# Patient Record
Sex: Male | Born: 1965
Health system: Southern US, Community
[De-identification: ages and names within clinical notes are randomized; demographics above are authoritative.]

## PROBLEM LIST (undated history)

## (undated) ENCOUNTER — Emergency Department (HOSPITAL_BASED_OUTPATIENT_CLINIC_OR_DEPARTMENT_OTHER): Admission: EM | Payer: Self-pay | Source: Home / Self Care

## (undated) DIAGNOSIS — I10 Essential (primary) hypertension: Secondary | ICD-10-CM

## (undated) DIAGNOSIS — E119 Type 2 diabetes mellitus without complications: Secondary | ICD-10-CM

## (undated) DIAGNOSIS — E785 Hyperlipidemia, unspecified: Secondary | ICD-10-CM

## (undated) DIAGNOSIS — I639 Cerebral infarction, unspecified: Secondary | ICD-10-CM

## (undated) HISTORY — PX: WRIST SURGERY: SHX841

---

## 2001-06-05 ENCOUNTER — Emergency Department (HOSPITAL_COMMUNITY): Admission: EM | Admit: 2001-06-05 | Discharge: 2001-06-05 | Payer: Self-pay | Admitting: Emergency Medicine

## 2004-07-13 ENCOUNTER — Emergency Department: Payer: Self-pay | Admitting: Emergency Medicine

## 2011-12-23 ENCOUNTER — Emergency Department (HOSPITAL_BASED_OUTPATIENT_CLINIC_OR_DEPARTMENT_OTHER)
Admission: EM | Admit: 2011-12-23 | Discharge: 2011-12-24 | Disposition: A | Discharge status: Home / Self Care | Payer: Self-pay | Attending: Emergency Medicine | Admitting: Emergency Medicine

## 2011-12-23 ENCOUNTER — Encounter (HOSPITAL_BASED_OUTPATIENT_CLINIC_OR_DEPARTMENT_OTHER): Payer: Self-pay | Admitting: Emergency Medicine

## 2011-12-23 DIAGNOSIS — K0889 Other specified disorders of teeth and supporting structures: Secondary | ICD-10-CM

## 2011-12-23 DIAGNOSIS — E785 Hyperlipidemia, unspecified: Secondary | ICD-10-CM | POA: Insufficient documentation

## 2011-12-23 DIAGNOSIS — I10 Essential (primary) hypertension: Secondary | ICD-10-CM | POA: Insufficient documentation

## 2011-12-23 DIAGNOSIS — K089 Disorder of teeth and supporting structures, unspecified: Secondary | ICD-10-CM | POA: Insufficient documentation

## 2011-12-23 HISTORY — DX: Essential (primary) hypertension: I10

## 2011-12-23 HISTORY — DX: Hyperlipidemia, unspecified: E78.5

## 2011-12-23 NOTE — ED Notes (Signed)
Pt c/o upper left sided tooth pain x 2 weeks.

## 2011-12-24 MED ORDER — PENICILLIN V POTASSIUM 500 MG PO TABS: 500.0000 mg | ORAL_TABLET | Freq: Three times a day (TID) | ORAL | Status: AC

## 2011-12-24 MED ORDER — HYDROCODONE-ACETAMINOPHEN 5-325 MG PO TABS
1.0000 | ORAL_TABLET | Freq: Four times a day (QID) | ORAL | Status: AC | PRN
Start: 2011-12-24 — End: 2012-01-03

## 2011-12-24 NOTE — Discharge Instructions (Signed)
Dental Pain  A tooth ache may be caused by cavities (tooth decay). Cavities expose the nerve of the tooth to air and hot or cold temperatures. It may come from an infection or abscess (also called a boil or furuncle) around your tooth. It is also often caused by dental caries (tooth decay). This causes the pain you are having.  DIAGNOSIS   Your caregiver can diagnose this problem by exam.  TREATMENT   · If caused by an infection, it may be treated with medications which kill germs (antibiotics) and pain medications as prescribed by your caregiver. Take medications as directed.  · Only take over-the-counter or prescription medicines for pain, discomfort, or fever as directed by your caregiver.  · Whether the tooth ache today is caused by infection or dental disease, you should see your dentist as soon as possible for further care.  SEEK MEDICAL CARE IF:  The exam and treatment you received today has been provided on an emergency basis only. This is not a substitute for complete medical or dental care. If your problem worsens or new problems (symptoms) appear, and you are unable to meet with your dentist, call or return to this location.  SEEK IMMEDIATE MEDICAL CARE IF:   · You have a fever.  · You develop redness and swelling of your face, jaw, or neck.  · You are unable to open your mouth.  · You have severe pain uncontrolled by pain medicine.  MAKE SURE YOU:   · Understand these instructions.  · Will watch your condition.  · Will get help right away if you are not doing well or get worse.  Document Released: 08/09/2005 Document Revised: 07/29/2011 Document Reviewed: 03/27/2008  Exit[employer]® Patient Information ©2012 [employer].

## 2011-12-24 NOTE — ED Notes (Signed)
Dr. Molpus at bedside. 

## 2011-12-24 NOTE — ED Provider Notes (Signed)
History     CSN: [id number]  Arrival date & time 12/23/11  2253   First MD Initiated Contact with Patient 12/24/11 0045      Chief Complaint  Patient presents with  . Dental Pain    (Consider location/radiation/quality/duration/timing/severity/associated sxs/prior treatment) HPI Is a 46 year old black male with about a two-week history of pain in his left upper first molar. He states that that tooth was fractured some time back. He states food gets caught in it and he has trouble keeping it clean. He states the pain is moderate to severe and worse with eating or percussion of the tooth.  Past Medical History  Diagnosis Date  . Hypertension   . Hyperlipemia     Past Surgical History  Procedure Date  . Wrist surgery     No family history on file.  History  Substance Use Topics  . Smoking status: Current Everyday Smoker  . Smokeless tobacco: Not on file  . Alcohol Use: Yes      Review of Systems  All other systems reviewed and are negative.    Allergies  Review of patient's allergies indicates no known allergies.  Home Medications   Current Outpatient Rx  Name Route Sig Dispense Refill  . ACETAMINOPHEN 500 MG PO TABS Oral Take 1,000 mg by mouth every 6 (six) hours as needed. Patient used this medication for his toothache.    Marland Kitchen HYDROCHLOROTHIAZIDE 25 MG PO TABS Oral Take 25 mg by mouth daily.    Marland Kitchen LISINOPRIL 10 MG PO TABS Oral Take 10 mg by mouth daily.      BP 178/102  Pulse 90  Temp 98.2 F (36.8 C)  Resp 18  SpO2 100%  Physical Exam General: Well-developed, well-nourished male in no acute distress; appearance consistent with age of record HENT: normocephalic, atraumatic; Ellis II fracture of left upper first molar, tooth tender to percussion Eyes: Normal appearance Neck: supple Heart: regular rate and rhythm Lungs: clear to auscultation bilaterally Abdomen: soft; nondistended Extremities: No deformity; full range of motion Neurologic: Awake,  alert and oriented; motor function intact in all extremities and symmetric; no facial droop Skin: Warm and dry Psychiatric: Normal mood and affect    ED Course  Procedures (including critical care time)     MDM          Hanley Seamen, MD 12/24/11 0050

## 2012-05-22 ENCOUNTER — Encounter (HOSPITAL_BASED_OUTPATIENT_CLINIC_OR_DEPARTMENT_OTHER): Payer: Self-pay | Admitting: Family Medicine

## 2012-05-22 ENCOUNTER — Emergency Department (HOSPITAL_BASED_OUTPATIENT_CLINIC_OR_DEPARTMENT_OTHER)
Admission: EM | Admit: 2012-05-22 | Discharge: 2012-05-22 | Disposition: A | Discharge status: Home / Self Care | Payer: Self-pay | Attending: Emergency Medicine | Admitting: Emergency Medicine

## 2012-05-22 DIAGNOSIS — K089 Disorder of teeth and supporting structures, unspecified: Secondary | ICD-10-CM | POA: Insufficient documentation

## 2012-05-22 DIAGNOSIS — K0889 Other specified disorders of teeth and supporting structures: Secondary | ICD-10-CM

## 2012-05-22 DIAGNOSIS — I10 Essential (primary) hypertension: Secondary | ICD-10-CM | POA: Insufficient documentation

## 2012-05-22 DIAGNOSIS — E785 Hyperlipidemia, unspecified: Secondary | ICD-10-CM | POA: Insufficient documentation

## 2012-05-22 DIAGNOSIS — F17200 Nicotine dependence, unspecified, uncomplicated: Secondary | ICD-10-CM | POA: Insufficient documentation

## 2012-05-22 MED ORDER — HYDROCODONE-ACETAMINOPHEN 5-325 MG PO TABS: 2.0000 | ORAL_TABLET | ORAL | Status: AC | PRN

## 2012-05-22 MED ORDER — PENICILLIN V POTASSIUM 500 MG PO TABS: 500.0000 mg | ORAL_TABLET | Freq: Four times a day (QID) | ORAL | Status: AC

## 2012-05-22 NOTE — ED Notes (Signed)
Pt c/o left upper dental pain and sts "filling fell out" about 2 months ago. Pt sts ibuprofen and tramadol not working. Pt sts he does not have a dentist.

## 2012-05-22 NOTE — ED Provider Notes (Signed)
History     CSN: [id number]  Arrival date & time 05/22/12  1115   First MD Initiated Contact with Patient 05/22/12 1320      Chief Complaint  Patient presents with  . Dental Pain    (Consider location/radiation/quality/duration/timing/severity/associated sxs/prior treatment) Patient is a 46 y.o. male presenting with tooth pain. The history is provided by the patient. No language interpreter was used.  Dental PainThe primary symptoms include mouth pain. The symptoms began more than 1 month ago. The symptoms are worsening. The symptoms are new. The symptoms occur constantly.  Additional symptoms do not include: gum swelling.  Pt complains of a broken tooth.  Pt recently released from prison. No dentist  Past Medical History  Diagnosis Date  . Hypertension   . Hyperlipemia     Past Surgical History  Procedure Date  . Wrist surgery     No family history on file.  History  Substance Use Topics  . Smoking status: Current Every Day Smoker  . Smokeless tobacco: Not on file  . Alcohol Use: Yes      Review of Systems  HENT: Positive for dental problem.   All other systems reviewed and are negative.    Allergies  Review of patient's allergies indicates no known allergies.  Home Medications   Current Outpatient Rx  Name Route Sig Dispense Refill  . VERAPAMIL HCL PO Oral Take by mouth.    . ACETAMINOPHEN 500 MG PO TABS Oral Take 1,000 mg by mouth every 6 (six) hours as needed. Patient used this medication for his toothache.    Marland Kitchen HYDROCHLOROTHIAZIDE 25 MG PO TABS Oral Take 25 mg by mouth daily.    Marland Kitchen LISINOPRIL 10 MG PO TABS Oral Take 10 mg by mouth daily.      BP 180/105  Pulse 84  Temp 98.8 F (37.1 C) (Oral)  Resp 16  Ht 5\' 11"  (1.803 m)  Wt 215 lb (97.523 kg)  BMI 29.99 kg/m2  SpO2 99%  Physical Exam  Nursing note and vitals reviewed. Constitutional: He appears well-developed and well-nourished.  HENT:  Head: Normocephalic and atraumatic.  Right Ear:  External ear normal.       Broken tooth left upper molar  Eyes: Conjunctivae normal and EOM are normal. Pupils are equal, round, and reactive to light.  Neck: Normal range of motion. Neck supple.  Cardiovascular: Normal rate.   Pulmonary/Chest: Effort normal.  Musculoskeletal: Normal range of motion.  Neurological: He is alert.    ED Course  Procedures (including critical care time)  Labs Reviewed - No data to display No results found.   No diagnosis found.    MDM  pcn vk  And hydrocodone        Lonia Skinner [place], [place] 05/22/12 [phone]

## 2012-05-22 NOTE — ED Notes (Signed)
Pt c/o of pain in tooth, left upper back. Denies n/v, dizziness. States that pain worsens with eating. No swelling noted.

## 2012-05-22 NOTE — ED Provider Notes (Signed)
Medical screening examination/treatment/procedure(s) were performed by non-physician practitioner and as supervising physician I was immediately available for consultation/collaboration.   Ellwood Steidle, MD 05/22/12 1729 

## 2012-08-02 ENCOUNTER — Emergency Department (HOSPITAL_BASED_OUTPATIENT_CLINIC_OR_DEPARTMENT_OTHER)
Admission: EM | Admit: 2012-08-02 | Discharge: 2012-08-02 | Disposition: A | Discharge status: Home / Self Care | Payer: Self-pay | Attending: Emergency Medicine | Admitting: Emergency Medicine

## 2012-08-02 ENCOUNTER — Other Ambulatory Visit: Payer: Self-pay

## 2012-08-02 ENCOUNTER — Encounter (HOSPITAL_BASED_OUTPATIENT_CLINIC_OR_DEPARTMENT_OTHER): Payer: Self-pay | Admitting: *Deleted

## 2012-08-02 DIAGNOSIS — F17200 Nicotine dependence, unspecified, uncomplicated: Secondary | ICD-10-CM | POA: Insufficient documentation

## 2012-08-02 DIAGNOSIS — E785 Hyperlipidemia, unspecified: Secondary | ICD-10-CM | POA: Insufficient documentation

## 2012-08-02 DIAGNOSIS — Z79899 Other long term (current) drug therapy: Secondary | ICD-10-CM | POA: Insufficient documentation

## 2012-08-02 DIAGNOSIS — I10 Essential (primary) hypertension: Secondary | ICD-10-CM | POA: Insufficient documentation

## 2012-08-02 MED ORDER — LISINOPRIL 20 MG PO TABS: 10.0000 mg | ORAL_TABLET | Freq: Every day | ORAL | Status: DC

## 2012-08-02 MED ORDER — VERAPAMIL HCL ER 120 MG PO CP24: 120.0000 mg | ORAL_CAPSULE | Freq: Every day | ORAL | Status: AC

## 2012-08-02 MED ORDER — HYDROCHLOROTHIAZIDE 25 MG PO TABS: 25.0000 mg | ORAL_TABLET | Freq: Every day | ORAL | Status: DC

## 2012-08-02 NOTE — ED Notes (Signed)
Pt c/o hypertension , pt was sent from [facility] for increased bp x 2 day

## 2012-08-02 NOTE — ED Provider Notes (Addendum)
History     CSN: [id number]  Arrival date & time 08/02/12  1327   First MD Initiated Contact with Patient 08/02/12 1340      Chief Complaint  Patient presents with  . Hypertension    (Consider location/radiation/quality/duration/timing/severity/associated sxs/prior treatment) HPI Comments: Patient was sent over here with elevated blood pressure. He states his blood pressures been elevated for last 2 days. He denies any current symptoms. He had a headache yesterday but denies any headache today. Denies any nausea or vomiting. Denies any chest pain or shortness of breath. Denies any dizziness. Denies any other neurologic symptoms. He does have a history of hypertension and has been off his medications for about the last 9 days. He states that his last medication was taking when he was in inpatient rehabilitation. Then he went home he had run out of his medication and currently he is in [facility] inpatient rehabilitation.  Patient is a 46 y.o. male presenting with hypertension.  Hypertension Pertinent negatives include no chest pain, no abdominal pain, no headaches and no shortness of breath.    Past Medical History  Diagnosis Date  . Hypertension   . Hyperlipemia     Past Surgical History  Procedure Date  . Wrist surgery     History reviewed. No pertinent family history.  History  Substance Use Topics  . Smoking status: Current Every Day Smoker -- 0.5 packs/day    Types: Cigarettes  . Smokeless tobacco: Not on file  . Alcohol Use: No      Review of Systems  Constitutional: Negative for fever, chills, diaphoresis and fatigue.  HENT: Negative for congestion, rhinorrhea and sneezing.   Eyes: Negative.   Respiratory: Negative for cough, chest tightness and shortness of breath.   Cardiovascular: Negative for chest pain and leg swelling.  Gastrointestinal: Negative for nausea, vomiting, abdominal pain, diarrhea and blood in stool.  Genitourinary: Negative for frequency,  hematuria, flank pain and difficulty urinating.  Musculoskeletal: Negative for back pain and arthralgias.  Skin: Negative for rash.  Neurological: Negative for dizziness, speech difficulty, weakness, numbness and headaches.    Allergies  Review of patient's allergies indicates no known allergies.  Home Medications   Current Outpatient Rx  Name  Route  Sig  Dispense  Refill  . ACETAMINOPHEN 500 MG PO TABS   Oral   Take 1,000 mg by mouth every 6 (six) hours as needed. Patient used this medication for his toothache.         Marland Kitchen HYDROCHLOROTHIAZIDE 25 MG PO TABS   Oral   Take 25 mg by mouth daily.         Marland Kitchen HYDROCHLOROTHIAZIDE 25 MG PO TABS   Oral   Take 1 tablet (25 mg total) by mouth daily.   30 tablet   1   . LISINOPRIL 10 MG PO TABS   Oral   Take 10 mg by mouth daily.         Marland Kitchen LISINOPRIL 20 MG PO TABS   Oral   Take 0.5 tablets (10 mg total) by mouth daily.   30 tablet   1   . VERAPAMIL HCL ER 120 MG PO CP24   Oral   Take 1 capsule (120 mg total) by mouth at bedtime.   30 capsule   1   . VERAPAMIL HCL PO   Oral   Take by mouth.           BP 184/98  Pulse 91  Temp 98.8 F (37.1 C) (  Oral)  Resp 20  Ht 5\' 11"  (1.803 m)  Wt 205 lb (92.987 kg)  BMI 28.59 kg/m2  SpO2 100%  Physical Exam  Constitutional: He is oriented to person, place, and time. He appears well-developed and well-nourished.  HENT:  Head: Normocephalic and atraumatic.  Eyes: Pupils are equal, round, and reactive to light.  Neck: Normal range of motion. Neck supple.  Cardiovascular: Normal rate, regular rhythm and normal heart sounds.   Pulmonary/Chest: Effort normal and breath sounds normal. No respiratory distress. He has no wheezes. He has no rales. He exhibits no tenderness.  Abdominal: Soft. Bowel sounds are normal. There is no tenderness. There is no rebound and no guarding.  Musculoskeletal: Normal range of motion. He exhibits no edema.  Lymphadenopathy:    He has no cervical  adenopathy.  Neurological: He is alert and oriented to person, place, and time. He has normal strength. No cranial nerve deficit or sensory deficit. GCS eye subscore is 4. GCS verbal subscore is 5. GCS motor subscore is 6.  Skin: Skin is warm and dry. No rash noted.  Psychiatric: He has a normal mood and affect.    ED Course  Procedures (including critical care time)  Labs Reviewed - No data to display No results found.   Date: 08/02/2012  Rate: 87  Rhythm: normal sinus rhythm  QRS Axis: left  Intervals: normal  ST/T Wave abnormalities: nonspecific ST/T changes  Conduction Disutrbances:none  Narrative Interpretation:   Old EKG Reviewed: unchanged   1. Hypertension       MDM  Patient presents with elevated blood pressure. He is currently asymptomatic. An EKG was obtained and is compared with an EKG from [facility] and appears to be unchanged. I did give him prescriptions for his 3 blood pressure medications and advised him to start taking them immediately. Advised to return if it's blood pressure worsens or if he develops any symptoms with elevated blood pressure. He has appointment next month to followup with his primary care physician at the free clinic in [facility].        Rolan Bucco, MD 08/02/12 1421  Rolan Bucco, MD 08/02/12 [phone]

## 2012-08-13 ENCOUNTER — Emergency Department (HOSPITAL_BASED_OUTPATIENT_CLINIC_OR_DEPARTMENT_OTHER)
Admission: EM | Admit: 2012-08-13 | Discharge: 2012-08-13 | Disposition: A | Discharge status: Home / Self Care | Payer: Self-pay | Attending: Emergency Medicine | Admitting: Emergency Medicine

## 2012-08-13 ENCOUNTER — Encounter (HOSPITAL_BASED_OUTPATIENT_CLINIC_OR_DEPARTMENT_OTHER): Payer: Self-pay | Admitting: *Deleted

## 2012-08-13 DIAGNOSIS — K0889 Other specified disorders of teeth and supporting structures: Secondary | ICD-10-CM

## 2012-08-13 DIAGNOSIS — I10 Essential (primary) hypertension: Secondary | ICD-10-CM | POA: Insufficient documentation

## 2012-08-13 DIAGNOSIS — Z79899 Other long term (current) drug therapy: Secondary | ICD-10-CM | POA: Insufficient documentation

## 2012-08-13 DIAGNOSIS — J392 Other diseases of pharynx: Secondary | ICD-10-CM | POA: Insufficient documentation

## 2012-08-13 DIAGNOSIS — F17200 Nicotine dependence, unspecified, uncomplicated: Secondary | ICD-10-CM | POA: Insufficient documentation

## 2012-08-13 DIAGNOSIS — K089 Disorder of teeth and supporting structures, unspecified: Secondary | ICD-10-CM | POA: Insufficient documentation

## 2012-08-13 DIAGNOSIS — E785 Hyperlipidemia, unspecified: Secondary | ICD-10-CM | POA: Insufficient documentation

## 2012-08-13 MED ORDER — AMOXICILLIN 500 MG PO CAPS: 500.0000 mg | ORAL_CAPSULE | Freq: Three times a day (TID) | ORAL | Status: DC

## 2012-08-13 MED ORDER — TRAMADOL HCL 50 MG PO TABS: 50.0000 mg | ORAL_TABLET | Freq: Four times a day (QID) | ORAL | Status: AC | PRN

## 2012-08-13 MED ORDER — TRAMADOL HCL 50 MG PO TABS: 50.0000 mg | ORAL_TABLET | Freq: Four times a day (QID) | ORAL | Status: DC | PRN

## 2012-08-13 NOTE — ED Notes (Signed)
Pt states he is having dental pain. Tried sealant, but it came out and pain has been worse  Since. Also wants throat checked.

## 2012-08-13 NOTE — ED Provider Notes (Signed)
History     CSN: [id number]  Arrival date & time 08/13/12  1324   First MD Initiated Contact with Patient 08/13/12 1902      Chief Complaint  Patient presents with  . Dental Pain    (Consider location/radiation/quality/duration/timing/severity/associated sxs/prior treatment) Patient is a 46 y.o. male presenting with tooth pain. The history is provided by the patient. No language interpreter was used.  Dental PainThe primary symptoms include mouth pain. The symptoms began more than 1 week ago. The symptoms are worsening. The symptoms are new. The symptoms occur constantly.  Additional symptoms include: gum tenderness and facial swelling. Medical issues do not include: alcohol problem.    Past Medical History  Diagnosis Date  . Hypertension   . Hyperlipemia     Past Surgical History  Procedure Date  . Wrist surgery     History reviewed. No pertinent family history.  History  Substance Use Topics  . Smoking status: Current Every Day Smoker -- 0.5 packs/day    Types: Cigarettes  . Smokeless tobacco: Not on file  . Alcohol Use: No      Review of Systems  HENT: Positive for facial swelling and dental problem.   All other systems reviewed and are negative.    Allergies  Review of patient's allergies indicates no known allergies.  Home Medications   Current Outpatient Rx  Name  Route  Sig  Dispense  Refill  . ACETAMINOPHEN 500 MG PO TABS   Oral   Take 1,000 mg by mouth every 6 (six) hours as needed. Patient used this medication for his toothache.         Marland Kitchen HYDROCHLOROTHIAZIDE 25 MG PO TABS   Oral   Take 25 mg by mouth daily.         Marland Kitchen HYDROCHLOROTHIAZIDE 25 MG PO TABS   Oral   Take 1 tablet (25 mg total) by mouth daily.   30 tablet   1   . LISINOPRIL 10 MG PO TABS   Oral   Take 10 mg by mouth daily.         Marland Kitchen LISINOPRIL 20 MG PO TABS   Oral   Take 0.5 tablets (10 mg total) by mouth daily.   30 tablet   1   . VERAPAMIL HCL ER 120 MG PO  CP24   Oral   Take 1 capsule (120 mg total) by mouth at bedtime.   30 capsule   1   . VERAPAMIL HCL PO   Oral   Take by mouth.           BP 166/85  Pulse 76  Temp 99.1 F (37.3 C) (Oral)  Resp 20  Ht 5\' 11"  (1.803 m)  Wt 205 lb (92.987 kg)  BMI 28.59 kg/m2  SpO2 99%  Physical Exam  Nursing note and vitals reviewed. Constitutional: He appears well-developed and well-nourished.  HENT:  Head: Normocephalic and atraumatic.  Right Ear: External ear normal.  Left Ear: External ear normal.  Nose: Nose normal.       Swollen gumline,  Throat clear  Eyes: Conjunctivae normal and EOM are normal. Pupils are equal, round, and reactive to light.  Neck: Normal range of motion.  Cardiovascular: Normal rate.   Pulmonary/Chest: Effort normal.  Neurological: He is alert.  Skin: Skin is warm.    ED Course  Procedures (including critical care time)  Labs Reviewed - No data to display No results found.   1. Toothache   2. Pharyngeal pain     (  Pt reports cough at night and drainage  MDM  amoxicillian and tramadol.  Follow up with Dr. Ninetta Lights dentist.         Lonia Skinner [place], [place] 08/13/12 2204  Lonia Skinner [place], [place] 08/13/12 2204  Lonia Skinner [place], [place] 08/13/12 2204

## 2012-08-14 NOTE — ED Provider Notes (Signed)
Medical screening examination/treatment/procedure(s) were performed by non-physician practitioner and as supervising physician I was immediately available for consultation/collaboration.  Hulon Ferron, MD 08/14/12 2244 

## 2013-06-17 ENCOUNTER — Encounter (HOSPITAL_BASED_OUTPATIENT_CLINIC_OR_DEPARTMENT_OTHER): Payer: Self-pay | Admitting: Emergency Medicine

## 2013-06-17 ENCOUNTER — Emergency Department (HOSPITAL_BASED_OUTPATIENT_CLINIC_OR_DEPARTMENT_OTHER): Payer: Medicaid Other

## 2013-06-17 ENCOUNTER — Emergency Department (HOSPITAL_BASED_OUTPATIENT_CLINIC_OR_DEPARTMENT_OTHER)
Admission: EM | Admit: 2013-06-17 | Discharge: 2013-06-17 | Disposition: A | Discharge status: Home / Self Care | Payer: Medicaid Other | Attending: Emergency Medicine | Admitting: Emergency Medicine

## 2013-06-17 DIAGNOSIS — Z79899 Other long term (current) drug therapy: Secondary | ICD-10-CM | POA: Insufficient documentation

## 2013-06-17 DIAGNOSIS — R209 Unspecified disturbances of skin sensation: Secondary | ICD-10-CM | POA: Insufficient documentation

## 2013-06-17 DIAGNOSIS — H539 Unspecified visual disturbance: Secondary | ICD-10-CM | POA: Insufficient documentation

## 2013-06-17 DIAGNOSIS — R079 Chest pain, unspecified: Secondary | ICD-10-CM

## 2013-06-17 DIAGNOSIS — I10 Essential (primary) hypertension: Secondary | ICD-10-CM | POA: Insufficient documentation

## 2013-06-17 DIAGNOSIS — M1990 Unspecified osteoarthritis, unspecified site: Secondary | ICD-10-CM

## 2013-06-17 DIAGNOSIS — E785 Hyperlipidemia, unspecified: Secondary | ICD-10-CM | POA: Insufficient documentation

## 2013-06-17 DIAGNOSIS — M545 Low back pain, unspecified: Secondary | ICD-10-CM | POA: Insufficient documentation

## 2013-06-17 DIAGNOSIS — F17200 Nicotine dependence, unspecified, uncomplicated: Secondary | ICD-10-CM | POA: Insufficient documentation

## 2013-06-17 DIAGNOSIS — M549 Dorsalgia, unspecified: Secondary | ICD-10-CM

## 2013-06-17 DIAGNOSIS — R51 Headache: Secondary | ICD-10-CM | POA: Insufficient documentation

## 2013-06-17 DIAGNOSIS — Z792 Long term (current) use of antibiotics: Secondary | ICD-10-CM | POA: Insufficient documentation

## 2013-06-17 DIAGNOSIS — IMO0002 Reserved for concepts with insufficient information to code with codable children: Secondary | ICD-10-CM | POA: Insufficient documentation

## 2013-06-17 LAB — TROPONIN I: Troponin I: <0.3 ng/mL (ref <0.30)

## 2013-06-17 LAB — BASIC METABOLIC PANEL
CO2: 25 mEq/L (ref 19–32)
Chloride: 106 mEq/L (ref 96–112)
Creatinine, Ser: 1.4 mg/dL — ABNORMAL HIGH (ref 0.50–1.35)
Sodium: 141 mEq/L (ref 135–145)

## 2013-06-17 LAB — CBC
Hemoglobin: 10.9 g/dL — ABNORMAL LOW (ref 13.0–17.0)
MCV: 72.7 fL — ABNORMAL LOW (ref 78.0–100.0)
Platelets: 347 10*3/uL (ref 150–400)
RBC: 4.77 MIL/uL (ref 4.22–5.81)
WBC: 9.2 10*3/uL (ref 4.0–10.5)

## 2013-06-17 MED ORDER — NAPROXEN 500 MG PO TABS: 500.0000 mg | ORAL_TABLET | Freq: Two times a day (BID) | ORAL | Status: AC

## 2013-06-17 MED ORDER — IBUPROFEN 800 MG PO TABS
800.0000 mg | ORAL_TABLET | Freq: Once | ORAL | Status: AC
  Administered 2013-06-17: 800 mg via ORAL
  Filled 2013-06-17: qty 1
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
## 2013-06-17 NOTE — ED Notes (Signed)
Patient states that every time he saw a dr while in prison, he would be transferred to another jail so that "they didn't have to do their job." He also stated that he was seen by a dr at one point and that he was put in a room where he had to sleep on the floor and there were rats everywhere.

## 2013-06-17 NOTE — ED Notes (Signed)
RN at bedside

## 2013-06-17 NOTE — ED Notes (Addendum)
Patient states that he is having chest pain, back pain, headache, and memory problems. States that his problems started when he went to prison, which he stayed in for 5/6 months. He states that "something isnt right, but they didn't have time to treat me". Patient was released from jail within the past few weeks. He states that the drs weren't doing what they were supposed to be doing and that he just keep forgetting things. His eyes are burning and "real bad" headaches. States his right arm gets numb also. He went to [facility] yesterday but left after being seen because "they were too busy". Patient also came here yesterday but left before triage.

## 2013-06-20 NOTE — ED Provider Notes (Signed)
Medical screening examination/treatment/procedure(s) were performed by non-physician practitioner and as supervising physician I was immediately available for consultation/collaboration.  EKG Interpretation   None        Corneisha Alvi, MD 06/20/13 0700 

## 2014-02-25 ENCOUNTER — Encounter (HOSPITAL_BASED_OUTPATIENT_CLINIC_OR_DEPARTMENT_OTHER): Payer: Self-pay | Admitting: Emergency Medicine

## 2014-02-25 ENCOUNTER — Emergency Department (HOSPITAL_BASED_OUTPATIENT_CLINIC_OR_DEPARTMENT_OTHER)
Admission: EM | Admit: 2014-02-25 | Discharge: 2014-02-25 | Disposition: A | Discharge status: Home / Self Care | Payer: Medicaid Other | Attending: Emergency Medicine | Admitting: Emergency Medicine

## 2014-02-25 DIAGNOSIS — H7290 Unspecified perforation of tympanic membrane, unspecified ear: Secondary | ICD-10-CM | POA: Diagnosis not present

## 2014-02-25 DIAGNOSIS — H9209 Otalgia, unspecified ear: Secondary | ICD-10-CM | POA: Diagnosis present

## 2014-02-25 DIAGNOSIS — Z8673 Personal history of transient ischemic attack (TIA), and cerebral infarction without residual deficits: Secondary | ICD-10-CM | POA: Diagnosis not present

## 2014-02-25 DIAGNOSIS — F17200 Nicotine dependence, unspecified, uncomplicated: Secondary | ICD-10-CM | POA: Diagnosis not present

## 2014-02-25 DIAGNOSIS — H7291 Unspecified perforation of tympanic membrane, right ear: Secondary | ICD-10-CM

## 2014-02-25 DIAGNOSIS — Z791 Long term (current) use of non-steroidal anti-inflammatories (NSAID): Secondary | ICD-10-CM | POA: Insufficient documentation

## 2014-02-25 DIAGNOSIS — E119 Type 2 diabetes mellitus without complications: Secondary | ICD-10-CM | POA: Diagnosis not present

## 2014-02-25 DIAGNOSIS — I10 Essential (primary) hypertension: Secondary | ICD-10-CM | POA: Diagnosis not present

## 2014-02-25 DIAGNOSIS — Z79899 Other long term (current) drug therapy: Secondary | ICD-10-CM | POA: Diagnosis not present

## 2014-02-25 HISTORY — DX: Cerebral infarction, unspecified: I63.9

## 2014-02-25 HISTORY — DX: Type 2 diabetes mellitus without complications: E11.9

## 2014-02-25 MED ORDER — AMOXICILLIN 500 MG PO CAPS: 500.0000 mg | ORAL_CAPSULE | Freq: Three times a day (TID) | ORAL | Status: AC

## 2014-02-25 MED ORDER — HYDROCODONE-ACETAMINOPHEN 5-325 MG PO TABS: 2.0000 | ORAL_TABLET | ORAL | Status: DC | PRN

## 2014-02-25 NOTE — ED Provider Notes (Signed)
CSN: [id number]     Arrival date & time 02/25/14  1901 History   First MD Initiated Contact with Patient 02/25/14 2203     Chief Complaint  Patient presents with  . Otalgia     (Consider location/radiation/quality/duration/timing/severity/associated sxs/prior Treatment) Patient is a 48 y.o. male presenting with ear pain. The history is provided by the patient and a relative. History limited by: Pt nonverbal second to a cva. No language interpreter was used.  Otalgia Location:  Right Quality:  Aching Severity:  Moderate Onset quality:  Gradual Timing:  Constant Progression:  Worsening Chronicity:  New Relieved by:  Nothing Worsened by:  Nothing tried Ineffective treatments:  None tried Associated symptoms: no rhinorrhea and no sore throat     Past Medical History  Diagnosis Date  . Hypertension   . Hyperlipemia   . Stroke   . Diabetes mellitus without complication    Past Surgical History  Procedure Laterality Date  . Wrist surgery     No family history on file. History  Substance Use Topics  . Smoking status: Current Every Day Smoker -- 0.50 packs/day    Types: Cigarettes  . Smokeless tobacco: Not on file  . Alcohol Use: No    Review of Systems  HENT: Positive for ear pain. Negative for rhinorrhea and sore throat.   All other systems reviewed and are negative.     Allergies  Review of patient's allergies indicates no known allergies.  Home Medications   Prior to Admission medications   Medication Sig Start Date End Date Taking? Authorizing Provider  metFORMIN (GLUCOPHAGE) 1000 MG tablet Take 1,000 mg by mouth 2 (two) times daily with a meal.   Yes Historical Provider, MD  acetaminophen (TYLENOL) 500 MG tablet Take 1,000 mg by mouth every 6 (six) hours as needed. Patient used this medication for his toothache.    Historical Provider, MD  amoxicillin (AMOXIL) 500 MG capsule Take 1 capsule (500 mg total) by mouth 3 (three) times daily. 08/13/12   Elson AreasLeslie K  Diarra Kos, PA-C  hydrochlorothiazide (HYDRODIURIL) 25 MG tablet Take 25 mg by mouth daily.    Historical Provider, MD  hydrochlorothiazide (HYDRODIURIL) 25 MG tablet Take 1 tablet (25 mg total) by mouth daily. 08/02/12   Rolan BuccoMelanie Belfi, MD  lisinopril (PRINIVIL,ZESTRIL) 10 MG tablet Take 10 mg by mouth daily.    Historical Provider, MD  lisinopril (PRINIVIL,ZESTRIL) 20 MG tablet Take 0.5 tablets (10 mg total) by mouth daily. 08/02/12   Rolan BuccoMelanie Belfi, MD  naproxen (NAPROSYN) 500 MG tablet Take 1 tablet (500 mg total) by mouth 2 (two) times daily. 06/17/13   Trevor Maceobyn M Albert, PA-C  traMADol (ULTRAM) 50 MG tablet Take 1 tablet (50 mg total) by mouth every 6 (six) hours as needed for pain. 08/13/12   Elson AreasLeslie K Arling Cerone, PA-C  verapamil (VERELAN PM) 120 MG 24 hr capsule Take 1 capsule (120 mg total) by mouth at bedtime. 08/02/12   Rolan BuccoMelanie Belfi, MD  VERAPAMIL HCL PO Take by mouth.    Historical Provider, MD   BP 129/77  Pulse 87  Temp(Src) 98.3 F (36.8 C) (Oral)  Resp 18  Ht 5\' 11"  (1.803 m)  Wt 190 lb (86.183 kg)  BMI 26.51 kg/m2  SpO2 100% Physical Exam  Nursing note and vitals reviewed. Constitutional: He is oriented to person, place, and time. He appears well-developed and well-nourished.  HENT:  Head: Normocephalic.  Left Ear: External ear normal.  Mouth/Throat: Oropharynx is clear and moist.  Right ear canal,  Dried blood,  Small amount of bright blood.   Eyes: EOM are normal.  Neck: Normal range of motion.  Pulmonary/Chest: Effort normal.  Abdominal: He exhibits no distension.  Musculoskeletal: Normal range of motion.  Neurological: He is alert and oriented to person, place, and time.  Psychiatric: He has a normal mood and affect.    ED Course  Procedures (including critical care time) Labs Review Labs Reviewed - No data to display  Imaging Review No results found.   EKG Interpretation None      MDM  I suspect pt has a perforated Tm.   (from family it seems pt has discomfort  before blood)  They deny pt has put anything in his ear.  I will treat with antibiotics and pain medication.   They are advised to see Dr. Emeline DarlingGore for evaluation   Final diagnoses:  Perforated tympanic membrane, right        Elson AreasLeslie K Cledith Abdou, PA-C 02/25/14 2234

## 2014-02-25 NOTE — ED Notes (Signed)
Pt with right ear pain, decreased hearing and drainage today.

## 2014-02-25 NOTE — Discharge Instructions (Signed)
Eardrum Perforation °The eardrum is a thin, round tissue inside the ear that separates the ear canal from the middle ear. This is the tissue that detects sound and enables you to hear. The eardrum can be punctured or torn (perforated). Eardrums generally heal without help and with little or no permanent hearing loss. °CAUSES  °· Sudden pressure changes that happen in situations like scuba diving or flying in an airplane. °· Foreign objects in the ear. °· Inserting a cotton-tipped swab in the ear. °· Loud noise. °· Trauma to the ear. °SYMPTOMS  °· Hearing loss. °· Ear pain. °· Ringing in the ears. °· Discharge or bleeding from the ear. °· Dizziness. °· Vomiting. °· Facial paralysis. °HOME CARE INSTRUCTIONS  °· Keep your ear dry, as this improves healing. Swimming, diving, and showers are not allowed until healing is complete. While bathing, protect the ear by placing a piece of cotton covered with petroleum jelly in the outer ear canal. °· Only take over-the-counter or prescription medicines for pain, discomfort, or fever as directed by your caregiver. °· Blow your nose gently. Forceful blowing increases the pressure in the middle ear and may cause further injury or delay healing. °· Resume normal activities, such as showering, when the perforation has healed. Your caregiver can let you know when this has occurred. °· Talk to your caregiver before flying on an airplane. Air travel is generally allowed with a perforated eardrum. °· If your caregiver has given you a follow-up appointment, it is very important to keep that appointment. Failure to keep the appointment could result in a chronic or permanent injury, pain, hearing loss, and disability. °SEEK IMMEDIATE MEDICAL CARE IF:  °· You have bleeding or pus coming from your ear. °· You have problems with balance, dizziness, nausea, or vomiting. °· You develop increased pain. °· You have a fever. °MAKE SURE YOU:  °· Understand these instructions. °· Will watch your  condition. °· Will get help right away if you are not doing well or get worse. °Document Released: 08/06/2000 Document Revised: 11/01/2011 Document Reviewed: 08/08/2008 °ExitCare® Patient Information ©2015 [employer]. This information is not intended to replace advice given to you by your health care provider. Make sure you discuss any questions you have with your health care provider. ° °

## 2014-02-26 NOTE — ED Provider Notes (Signed)
Medical screening examination/treatment/procedure(s) were performed by non-physician practitioner and as supervising physician I was immediately available for consultation/collaboration.   EKG Interpretation None       Gearldine Looney, MD 02/26/14 0017 

## 2014-05-27 ENCOUNTER — Emergency Department (HOSPITAL_BASED_OUTPATIENT_CLINIC_OR_DEPARTMENT_OTHER)
Admission: EM | Admit: 2014-05-27 | Discharge: 2014-05-27 | Disposition: A | Discharge status: Home / Self Care | Payer: Medicaid Other | Attending: Emergency Medicine | Admitting: Emergency Medicine

## 2014-05-27 ENCOUNTER — Encounter (HOSPITAL_BASED_OUTPATIENT_CLINIC_OR_DEPARTMENT_OTHER): Payer: Self-pay | Admitting: Emergency Medicine

## 2014-05-27 DIAGNOSIS — Z720 Tobacco use: Secondary | ICD-10-CM | POA: Insufficient documentation

## 2014-05-27 DIAGNOSIS — Z8673 Personal history of transient ischemic attack (TIA), and cerebral infarction without residual deficits: Secondary | ICD-10-CM | POA: Insufficient documentation

## 2014-05-27 DIAGNOSIS — I10 Essential (primary) hypertension: Secondary | ICD-10-CM | POA: Diagnosis not present

## 2014-05-27 DIAGNOSIS — Z79899 Other long term (current) drug therapy: Secondary | ICD-10-CM | POA: Insufficient documentation

## 2014-05-27 DIAGNOSIS — K088 Other specified disorders of teeth and supporting structures: Secondary | ICD-10-CM | POA: Diagnosis not present

## 2014-05-27 DIAGNOSIS — Z791 Long term (current) use of non-steroidal anti-inflammatories (NSAID): Secondary | ICD-10-CM | POA: Diagnosis not present

## 2014-05-27 DIAGNOSIS — E119 Type 2 diabetes mellitus without complications: Secondary | ICD-10-CM | POA: Insufficient documentation

## 2014-05-27 DIAGNOSIS — K0889 Other specified disorders of teeth and supporting structures: Secondary | ICD-10-CM

## 2014-05-27 MED ORDER — HYDROCODONE-ACETAMINOPHEN 5-325 MG PO TABS: 2.0000 | ORAL_TABLET | ORAL | Status: DC | PRN

## 2014-05-27 MED ORDER — AMOXICILLIN 500 MG PO CAPS: 500.0000 mg | ORAL_CAPSULE | Freq: Three times a day (TID) | ORAL | Status: AC

## 2014-05-27 NOTE — ED Notes (Signed)
Dental pain for 3 days  

## 2014-05-27 NOTE — ED Provider Notes (Signed)
History/physical exam/procedure(s) were performed by non-physician practitioner and as supervising physician I was immediately available for consultation/collaboration. I have reviewed all notes and am in agreement with care and plan.   Hilario Quarryanielle S Johnthomas Lader, MD 05/27/14 [id number]

## 2014-05-27 NOTE — Discharge Instructions (Signed)

## 2014-05-27 NOTE — ED Provider Notes (Signed)
CSN: [id number]     Arrival date & time 05/27/14  1039 History   First MD Initiated Contact with Patient 05/27/14 1141     Chief Complaint  Patient presents with  . Dental Pain     (Consider location/radiation/quality/duration/timing/severity/associated sxs/prior Treatment) Patient is a 48 y.o. male presenting with tooth pain. The history is provided by the patient. No language interpreter was used.  Dental Pain Location:  Upper and lower Quality:  Throbbing Onset quality:  Gradual Duration:  3 days Progression:  Worsening Chronicity:  New Context: not abscess   Relieved by:  Nothing Worsened by:  Nothing tried Associated symptoms: gum swelling   Associated symptoms: no facial swelling     Past Medical History  Diagnosis Date  . Hypertension   . Hyperlipemia   . Stroke   . Diabetes mellitus without complication    Past Surgical History  Procedure Laterality Date  . Wrist surgery     No family history on file. History  Substance Use Topics  . Smoking status: Current Every Day Smoker -- 0.50 packs/day    Types: Cigarettes  . Smokeless tobacco: Not on file  . Alcohol Use: No    Review of Systems  HENT: Negative for facial swelling.   All other systems reviewed and are negative.     Allergies  Review of patient's allergies indicates no known allergies.  Home Medications   Prior to Admission medications   Medication Sig Start Date End Date Taking? Authorizing Provider  acetaminophen (TYLENOL) 500 MG tablet Take 1,000 mg by mouth every 6 (six) hours as needed. Patient used this medication for his toothache.    Historical Provider, MD  amoxicillin (AMOXIL) 500 MG capsule Take 1 capsule (500 mg total) by mouth 3 (three) times daily. 08/13/12   Elson Areas, PA-C  hydrochlorothiazide (HYDRODIURIL) 25 MG tablet Take 25 mg by mouth daily.    Historical Provider, MD  hydrochlorothiazide (HYDRODIURIL) 25 MG tablet Take 1 tablet (25 mg total) by mouth daily. 08/02/12    Rolan Bucco, MD  HYDROcodone-acetaminophen (NORCO/VICODIN) 5-325 MG per tablet Take 2 tablets by mouth every 4 (four) hours as needed. 02/25/14   Elson Areas, PA-C  lisinopril (PRINIVIL,ZESTRIL) 10 MG tablet Take 10 mg by mouth daily.    Historical Provider, MD  lisinopril (PRINIVIL,ZESTRIL) 20 MG tablet Take 0.5 tablets (10 mg total) by mouth daily. 08/02/12   Rolan Bucco, MD  metFORMIN (GLUCOPHAGE) 1000 MG tablet Take 1,000 mg by mouth 2 (two) times daily with a meal.    Historical Provider, MD  naproxen (NAPROSYN) 500 MG tablet Take 1 tablet (500 mg total) by mouth 2 (two) times daily. 06/17/13   Trevor Mace, PA-C  traMADol (ULTRAM) 50 MG tablet Take 1 tablet (50 mg total) by mouth every 6 (six) hours as needed for pain. 08/13/12   Elson Areas, PA-C  verapamil (VERELAN PM) 120 MG 24 hr capsule Take 1 capsule (120 mg total) by mouth at bedtime. 08/02/12   Rolan Bucco, MD  VERAPAMIL HCL PO Take by mouth.    Historical Provider, MD   BP 121/83  Pulse 68  Temp(Src) 98.2 F (36.8 C) (Oral)  Resp 16  Ht 5\' 10"  (1.778 m)  Wt 190 lb (86.183 kg)  BMI 27.26 kg/m2  SpO2 99% Physical Exam  Nursing note and vitals reviewed. Constitutional: He appears well-developed and well-nourished.  Cardiovascular: Normal rate.   Pulmonary/Chest: Effort normal.  Musculoskeletal: Normal range of motion.  Neurological: He  is alert.  Skin: Skin is warm.  Psychiatric: He has a normal mood and affect.    ED Course  Procedures (including critical care time) Labs Review Labs Reviewed - No data to display  Imaging Review No results found.   EKG Interpretation None      MDM     Final diagnoses:  Toothache    amoxicillain Hydrocodone  Elson AreasLeslie K Sofia, PA-C 05/27/14 1258

## 2014-08-17 ENCOUNTER — Encounter (HOSPITAL_BASED_OUTPATIENT_CLINIC_OR_DEPARTMENT_OTHER): Payer: Self-pay | Admitting: *Deleted

## 2014-08-17 ENCOUNTER — Emergency Department (HOSPITAL_BASED_OUTPATIENT_CLINIC_OR_DEPARTMENT_OTHER)
Admission: EM | Admit: 2014-08-17 | Discharge: 2014-08-17 | Disposition: A | Discharge status: Home / Self Care | Payer: Medicaid Other | Attending: Emergency Medicine | Admitting: Emergency Medicine

## 2014-08-17 ENCOUNTER — Emergency Department (HOSPITAL_BASED_OUTPATIENT_CLINIC_OR_DEPARTMENT_OTHER): Payer: Medicaid Other

## 2014-08-17 DIAGNOSIS — E119 Type 2 diabetes mellitus without complications: Secondary | ICD-10-CM | POA: Diagnosis not present

## 2014-08-17 DIAGNOSIS — R1084 Generalized abdominal pain: Secondary | ICD-10-CM | POA: Diagnosis present

## 2014-08-17 DIAGNOSIS — Z792 Long term (current) use of antibiotics: Secondary | ICD-10-CM | POA: Diagnosis not present

## 2014-08-17 DIAGNOSIS — Z8673 Personal history of transient ischemic attack (TIA), and cerebral infarction without residual deficits: Secondary | ICD-10-CM | POA: Insufficient documentation

## 2014-08-17 DIAGNOSIS — Z791 Long term (current) use of non-steroidal anti-inflammatories (NSAID): Secondary | ICD-10-CM | POA: Diagnosis not present

## 2014-08-17 DIAGNOSIS — Z79899 Other long term (current) drug therapy: Secondary | ICD-10-CM | POA: Diagnosis not present

## 2014-08-17 DIAGNOSIS — Z720 Tobacco use: Secondary | ICD-10-CM | POA: Insufficient documentation

## 2014-08-17 DIAGNOSIS — I10 Essential (primary) hypertension: Secondary | ICD-10-CM | POA: Insufficient documentation

## 2014-08-17 LAB — COMPREHENSIVE METABOLIC PANEL
ALT: 26 U/L (ref 0–53)
ANION GAP: 6 (ref 5–15)
AST: 17 U/L (ref 0–37)
Albumin: 3.9 g/dL (ref 3.5–5.2)
Alkaline Phosphatase: 69 U/L (ref 39–117)
BUN: 18 mg/dL (ref 6–23)
CALCIUM: 9 mg/dL (ref 8.4–10.5)
CO2: 26 mmol/L (ref 19–32)
CREATININE: 1.03 mg/dL (ref 0.50–1.35)
Chloride: 107 mEq/L (ref 96–112)
GFR, EST NON AFRICAN AMERICAN: 85 mL/min — AB (ref >90)
GLUCOSE: 77 mg/dL (ref 70–99)
Potassium: 3.7 mmol/L (ref 3.5–5.1)
Sodium: 139 mmol/L (ref 135–145)
TOTAL PROTEIN: 7.2 g/dL (ref 6.0–8.3)
Total Bilirubin: 0.7 mg/dL (ref 0.3–1.2)

## 2014-08-17 LAB — LIPASE, BLOOD: LIPASE: 25 U/L (ref 11–59)

## 2014-08-17 LAB — URINALYSIS, ROUTINE W REFLEX MICROSCOPIC
BILIRUBIN URINE: NEGATIVE
GLUCOSE, UA: NEGATIVE mg/dL
HGB URINE DIPSTICK: NEGATIVE
KETONES UR: 15 mg/dL — AB
Leukocytes, UA: NEGATIVE
Nitrite: NEGATIVE
PROTEIN: NEGATIVE mg/dL
Specific Gravity, Urine: 1.03 (ref 1.005–1.030)
UROBILINOGEN UA: 1 mg/dL (ref 0.0–1.0)
pH: 5.5 (ref 5.0–8.0)

## 2014-08-17 LAB — CBC WITH DIFFERENTIAL/PLATELET
BASOS PCT: 1 % (ref 0–1)
Basophils Absolute: 0.1 10*3/uL (ref 0.0–0.1)
EOS ABS: 0.1 10*3/uL (ref 0.0–0.7)
Eosinophils Relative: 1 % (ref 0–5)
HCT: 35.7 % — ABNORMAL LOW (ref 39.0–52.0)
HEMOGLOBIN: 11.5 g/dL — AB (ref 13.0–17.0)
LYMPHS ABS: 2.4 10*3/uL (ref 0.7–4.0)
Lymphocytes Relative: 29 % (ref 12–46)
MCH: 22.4 pg — AB (ref 26.0–34.0)
MCHC: 32.2 g/dL (ref 30.0–36.0)
MCV: 69.6 fL — ABNORMAL LOW (ref 78.0–100.0)
MONOS PCT: 7 % (ref 3–12)
Monocytes Absolute: 0.6 10*3/uL (ref 0.1–1.0)
NEUTROS ABS: 5.2 10*3/uL (ref 1.7–7.7)
NEUTROS PCT: 63 % (ref 43–77)
PLATELETS: 306 10*3/uL (ref 150–400)
RBC: 5.13 MIL/uL (ref 4.22–5.81)
RDW: 16.4 % — ABNORMAL HIGH (ref 11.5–15.5)
WBC: 8.3 10*3/uL (ref 4.0–10.5)

## 2014-08-17 MED ORDER — HYDROCODONE-ACETAMINOPHEN 5-325 MG PO TABS: 1.0000 | ORAL_TABLET | Freq: Four times a day (QID) | ORAL | Status: AC | PRN

## 2014-08-17 MED ORDER — IOHEXOL 300 MG/ML  SOLN
50.0000 mL | Freq: Once | INTRAMUSCULAR | Status: AC | PRN
  Administered 2014-08-17: 50 mL via ORAL

## 2014-08-17 MED ORDER — HYDROCHLOROTHIAZIDE 25 MG PO TABS: 25.0000 mg | ORAL_TABLET | Freq: Every day | ORAL | Status: AC

## 2014-08-17 MED ORDER — LISINOPRIL 10 MG PO TABS: 10.0000 mg | ORAL_TABLET | Freq: Every day | ORAL | Status: AC

## 2014-08-17 MED ORDER — IOHEXOL 300 MG/ML  SOLN
100.0000 mL | Freq: Once | INTRAMUSCULAR | Status: AC | PRN
  Administered 2014-08-17: 100 mL via INTRAVENOUS

## 2014-08-17 MED ORDER — SODIUM CHLORIDE 0.9 % IV BOLUS (SEPSIS)
500.0000 mL | Freq: Once | INTRAVENOUS | Status: AC
  Administered 2014-08-17: 500 mL via INTRAVENOUS

## 2014-08-17 NOTE — Discharge Instructions (Signed)
Hydrocodone as needed for pain.  Return to the emergency department if he develops severe pain, bloody stool, high fever, or other new and concerning symptoms.   Abdominal Pain Many things can cause abdominal pain. Usually, abdominal pain is not caused by a disease and will improve without treatment. It can often be observed and treated at home. Your health care provider will do a physical exam and possibly order blood tests and X-rays to help determine the seriousness of your pain. However, in many cases, more time must pass before a clear cause of the pain can be found. Before that point, your health care provider may not know if you need more testing or further treatment. HOME CARE INSTRUCTIONS  Monitor your abdominal pain for any changes. The following actions may help to alleviate any discomfort you are experiencing:  Only take over-the-counter or prescription medicines as directed by your health care provider.  Do not take laxatives unless directed to do so by your health care provider.  Try a clear liquid diet (broth, tea, or water) as directed by your health care provider. Slowly move to a bland diet as tolerated. SEEK MEDICAL CARE IF:  You have unexplained abdominal pain.  You have abdominal pain associated with nausea or diarrhea.  You have pain when you urinate or have a bowel movement.  You experience abdominal pain that wakes you in the night.  You have abdominal pain that is worsened or improved by eating food.  You have abdominal pain that is worsened with eating fatty foods.  You have a fever. SEEK IMMEDIATE MEDICAL CARE IF:   Your pain does not go away within 2 hours.  You keep throwing up (vomiting).  Your pain is felt only in portions of the abdomen, such as the right side or the left lower portion of the abdomen.  You pass bloody or black tarry stools. MAKE SURE YOU:  Understand these instructions.   Will watch your condition.   Will get help right  away if you are not doing well or get worse.  Document Released: 05/19/2005 Document Revised: 08/14/2013 Document Reviewed: 04/18/2013 [facility] Patient Information 2015 [place], [place]. This information is not intended to replace advice given to you by your health care provider. Make sure you discuss any questions you have with your health care provider.

## 2014-08-17 NOTE — ED Notes (Signed)
Pt c/o diffuse abd pain x 3 days

## 2014-08-17 NOTE — ED Provider Notes (Signed)
CSN: [id number]     Arrival date & time 08/17/14  1308 History  This chart was scribed for Geoffery Lyonsouglas Loralye Loberg, MD by Modena JanskyAlbert Thayil, ED Scribe. This patient was seen in room MH06/MH06 and the patient's care was started at 3:16 PM.    Chief Complaint  Patient presents with  . Abdominal Pain   Patient is a 48 y.o. male presenting with abdominal pain. The history is provided by a parent and the patient. No language interpreter was used.  Abdominal Pain Pain location:  Generalized Pain radiates to:  Does not radiate Pain severity:  Moderate Onset quality:  Gradual Duration:  4 days Timing:  Unable to specify Progression:  Unchanged Chronicity:  New Relieved by:  None tried Worsened by:  Nothing tried Ineffective treatments:  None tried Associated symptoms: no diarrhea, no nausea and no vomiting    HPI Comments: Gary Coleman is a 48 y.o. male who presents to the Emergency Department complaining of moderate generalized abdominal pain that started 3 days ago. His mother reports that pt has been complaining about stomach pain the past 3 days. She states that pt recently had a stroke, so he is nonverbal but can respond to questions. She states that he has no hx of abdominal surgeries. He denies any nausea, emesis, or diarrhea.   Past Medical History  Diagnosis Date  . Hypertension   . Hyperlipemia   . Stroke   . Diabetes mellitus without complication    Past Surgical History  Procedure Laterality Date  . Wrist surgery     History reviewed. No pertinent family history. History  Substance Use Topics  . Smoking status: Current Every Day Smoker -- 0.50 packs/day    Types: Cigarettes  . Smokeless tobacco: Not on file  . Alcohol Use: No    Review of Systems  Gastrointestinal: Positive for abdominal pain. Negative for nausea, vomiting and diarrhea.  All other systems reviewed and are negative.   Allergies  Review of patient's allergies indicates no known allergies.  Home Medications    Prior to Admission medications   Medication Sig Start Date End Date Taking? Authorizing Provider  acetaminophen (TYLENOL) 500 MG tablet Take 1,000 mg by mouth every 6 (six) hours as needed. Patient used this medication for his toothache.    Historical Provider, MD  amoxicillin (AMOXIL) 500 MG capsule Take 1 capsule (500 mg total) by mouth 3 (three) times daily. 08/13/12   Elson AreasLeslie K Sofia, PA-C  hydrochlorothiazide (HYDRODIURIL) 25 MG tablet Take 25 mg by mouth daily.    Historical Provider, MD  hydrochlorothiazide (HYDRODIURIL) 25 MG tablet Take 1 tablet (25 mg total) by mouth daily. 08/02/12   Rolan BuccoMelanie Belfi, MD  HYDROcodone-acetaminophen (NORCO/VICODIN) 5-325 MG per tablet Take 2 tablets by mouth every 4 (four) hours as needed. 02/25/14   Elson AreasLeslie K Sofia, PA-C  HYDROcodone-acetaminophen (NORCO/VICODIN) 5-325 MG per tablet Take 2 tablets by mouth every 4 (four) hours as needed. 05/27/14   Elson AreasLeslie K Sofia, PA-C  lisinopril (PRINIVIL,ZESTRIL) 10 MG tablet Take 10 mg by mouth daily.    Historical Provider, MD  lisinopril (PRINIVIL,ZESTRIL) 20 MG tablet Take 0.5 tablets (10 mg total) by mouth daily. 08/02/12   Rolan BuccoMelanie Belfi, MD  metFORMIN (GLUCOPHAGE) 1000 MG tablet Take 1,000 mg by mouth 2 (two) times daily with a meal.    Historical Provider, MD  naproxen (NAPROSYN) 500 MG tablet Take 1 tablet (500 mg total) by mouth 2 (two) times daily. 06/17/13   Kathrynn Speedobyn M Hess, PA-C  traMADol Janean Sark(ULTRAM)  50 MG tablet Take 1 tablet (50 mg total) by mouth every 6 (six) hours as needed for pain. 08/13/12   Elson AreasLeslie K Sofia, PA-C  verapamil (VERELAN PM) 120 MG 24 hr capsule Take 1 capsule (120 mg total) by mouth at bedtime. 08/02/12   Rolan BuccoMelanie Belfi, MD  VERAPAMIL HCL PO Take by mouth.    Historical Provider, MD   BP 160/89 mmHg  Pulse 72  Temp(Src) 98.3 F (36.8 C) (Oral)  Resp 16  Ht 5\' 8"  (1.727 m)  Wt 190 lb (86.183 kg)  BMI 28.90 kg/m2  SpO2 98% Physical Exam  Constitutional: He is oriented to person, place, and  time. He appears well-developed and well-nourished.  HENT:  Head: Normocephalic and atraumatic.  Neck: Neck supple. No tracheal deviation present.  Cardiovascular: Normal rate, regular rhythm and normal heart sounds.  Exam reveals no gallop and no friction rub.   No murmur heard. Pulmonary/Chest: Effort normal. No respiratory distress. He has no wheezes. He has no rales.  Abdominal: Soft. Bowel sounds are normal. He exhibits no distension. There is tenderness. There is no rebound and no guarding.  Mild TTP in all 4 quadrants.  Musculoskeletal: Normal range of motion.  Neurological: He is alert and oriented to person, place, and time.  Skin: Skin is warm and dry.  Psychiatric: He has a normal mood and affect. His behavior is normal.  Nursing note and vitals reviewed.   ED Course  Procedures (including critical care time) DIAGNOSTIC STUDIES: Oxygen Saturation is 98% on RA, normal by my interpretation.    COORDINATION OF CARE: 3:20 PM- Pt advised of plan for treatment which includes medication, radiology, and labs and pt agrees.  Labs Review Labs Reviewed  URINALYSIS, ROUTINE W REFLEX MICROSCOPIC - Abnormal; Notable for the following:    Color, Urine AMBER (*)    Ketones, ur 15 (*)    All other components within normal limits    Imaging Review No results found.   EKG Interpretation None      MDM   Final diagnoses:  None    Patient presents with a three-day history of generalized abdominal pain. Is somewhat difficult to get a history from due to an aphasia related to prior stroke. His workup reveals no elevation of white count and CT scan is unremarkable. I doubt an emergent process. He will be treated with pain medication and when necessary return.  I personally performed the services described in this documentation, which was scribed in my presence. The recorded information has been reviewed and is accurate.        Geoffery Lyonsouglas Joud Ingwersen, MD 08/18/14 [phone]

## 2015-08-02 ENCOUNTER — Encounter (HOSPITAL_BASED_OUTPATIENT_CLINIC_OR_DEPARTMENT_OTHER): Payer: Self-pay | Admitting: *Deleted

## 2015-08-02 ENCOUNTER — Emergency Department (HOSPITAL_BASED_OUTPATIENT_CLINIC_OR_DEPARTMENT_OTHER)
Admission: EM | Admit: 2015-08-02 | Discharge: 2015-08-02 | Disposition: A | Discharge status: Home / Self Care | Payer: Medicaid Other | Attending: Emergency Medicine | Admitting: Emergency Medicine

## 2015-08-02 ENCOUNTER — Emergency Department (HOSPITAL_BASED_OUTPATIENT_CLINIC_OR_DEPARTMENT_OTHER): Payer: Medicaid Other

## 2015-08-02 DIAGNOSIS — Z8673 Personal history of transient ischemic attack (TIA), and cerebral infarction without residual deficits: Secondary | ICD-10-CM | POA: Diagnosis not present

## 2015-08-02 DIAGNOSIS — F17210 Nicotine dependence, cigarettes, uncomplicated: Secondary | ICD-10-CM | POA: Insufficient documentation

## 2015-08-02 DIAGNOSIS — Z791 Long term (current) use of non-steroidal anti-inflammatories (NSAID): Secondary | ICD-10-CM | POA: Diagnosis not present

## 2015-08-02 DIAGNOSIS — M545 Low back pain: Secondary | ICD-10-CM | POA: Diagnosis not present

## 2015-08-02 DIAGNOSIS — Z792 Long term (current) use of antibiotics: Secondary | ICD-10-CM | POA: Diagnosis not present

## 2015-08-02 DIAGNOSIS — I10 Essential (primary) hypertension: Secondary | ICD-10-CM | POA: Insufficient documentation

## 2015-08-02 DIAGNOSIS — Z79899 Other long term (current) drug therapy: Secondary | ICD-10-CM | POA: Diagnosis not present

## 2015-08-02 DIAGNOSIS — E119 Type 2 diabetes mellitus without complications: Secondary | ICD-10-CM | POA: Diagnosis not present

## 2015-08-02 MED ORDER — CYCLOBENZAPRINE HCL 10 MG PO TABS
10.0000 mg | ORAL_TABLET | Freq: Once | ORAL | Status: AC
  Administered 2015-08-02: 10 mg via ORAL
  Filled 2015-08-02: qty 1

## 2015-08-02 MED ORDER — OXYCODONE-ACETAMINOPHEN 5-325 MG PO TABS
2.0000 | ORAL_TABLET | Freq: Once | ORAL | Status: AC
  Administered 2015-08-02: 2 via ORAL
  Filled 2015-08-02: qty 2

## 2015-08-02 MED ORDER — CYCLOBENZAPRINE HCL 10 MG PO TABS: 10.0000 mg | ORAL_TABLET | Freq: Three times a day (TID) | ORAL | Status: AC | PRN

## 2015-08-02 MED ORDER — OXYCODONE-ACETAMINOPHEN 5-325 MG PO TABS: 1.0000 | ORAL_TABLET | Freq: Four times a day (QID) | ORAL | Status: AC | PRN

## 2015-08-02 MED ORDER — IBUPROFEN 600 MG PO TABS: 600.0000 mg | ORAL_TABLET | Freq: Three times a day (TID) | ORAL | Status: AC | PRN

## 2015-08-02 MED ORDER — IBUPROFEN 400 MG PO TABS
600.0000 mg | ORAL_TABLET | Freq: Once | ORAL | Status: AC
  Administered 2015-08-02: 600 mg via ORAL
  Filled 2015-08-02: qty 1

## 2015-08-02 NOTE — ED Notes (Signed)
Patient c/o lower back pain for the past three days, no meds taken

## 2015-08-02 NOTE — ED Provider Notes (Signed)
CSN: [id number]     Arrival date & time 08/02/15  [id number] History   First MD Initiated Contact with Patient 08/02/15 [phone]     Chief Complaint  Patient presents with  . Back Pain     (Consider location/radiation/quality/duration/timing/severity/associated sxs/prior Treatment) HPI  49 year old male presents with low back pain for the past 3 days. History is taken from patient who respond by nodding as well as his mom at the bedside. Patient is unable to speak due to prior strokes. It seems the patient has had nontraumatic low back pain that radiates down both legs to his knees for the past 3 days. Similar episode a few years ago. No abdominal pain. No weakness or numbness in his lower extremities. No fevers. No known history of cancer. Has tried Tylenol without relief. No saddle anesthesia or bowel/bladder incontinence.  Past Medical History  Diagnosis Date  . Hypertension   . Hyperlipemia   . Stroke (HCC)   . Diabetes mellitus without complication [facility](HCC)    Past Surgical History  Procedure Laterality Date  . Wrist surgery     No family history on file. Social History  Substance Use Topics  . Smoking status: Current Every Day Smoker -- 0.50 packs/day    Types: Cigarettes  . Smokeless tobacco: None  . Alcohol Use: No    Review of Systems  Constitutional: Negative for fever.  Gastrointestinal: Negative for vomiting and abdominal pain.  Musculoskeletal: Positive for back pain.  Neurological: Negative for weakness and numbness.  All other systems reviewed and are negative.     Allergies  Review of patient's allergies indicates no known allergies.  Home Medications   Prior to Admission medications   Medication Sig Start Date End Date Taking? Authorizing Provider  acetaminophen (TYLENOL) 500 MG tablet Take 1,000 mg by mouth every 6 (six) hours as needed. Patient used this medication for his toothache.    Historical Provider, MD  amoxicillin (AMOXIL) 500 MG capsule Take 1  capsule (500 mg total) by mouth 3 (three) times daily. 08/13/12   Elson AreasLeslie K Sofia, PA-C  hydrochlorothiazide (HYDRODIURIL) 25 MG tablet Take 1 tablet (25 mg total) by mouth daily. 08/17/14   Geoffery Lyonsouglas Delo, MD  HYDROcodone-acetaminophen (NORCO) 5-325 MG per tablet Take 1-2 tablets by mouth every 6 (six) hours as needed. 08/17/14   Geoffery Lyonsouglas Delo, MD  lisinopril (PRINIVIL,ZESTRIL) 10 MG tablet Take 1 tablet (10 mg total) by mouth daily. 08/17/14   Geoffery Lyonsouglas Delo, MD  metFORMIN (GLUCOPHAGE) 1000 MG tablet Take 1,000 mg by mouth 2 (two) times daily with a meal.    Historical Provider, MD  naproxen (NAPROSYN) 500 MG tablet Take 1 tablet (500 mg total) by mouth 2 (two) times daily. 06/17/13   Kathrynn Speedobyn M Hess, PA-C  traMADol (ULTRAM) 50 MG tablet Take 1 tablet (50 mg total) by mouth every 6 (six) hours as needed for pain. 08/13/12   Elson AreasLeslie K Sofia, PA-C  verapamil (VERELAN PM) 120 MG 24 hr capsule Take 1 capsule (120 mg total) by mouth at bedtime. 08/02/12   Rolan BuccoMelanie Belfi, MD  VERAPAMIL HCL PO Take by mouth.    Historical Provider, MD   BP 181/109 mmHg  Pulse 81  Temp(Src) 98.2 F (36.8 C) (Oral)  Resp 18  Ht 5\' 11"  (1.803 m)  Wt 208 lb 1.6 oz (94.394 kg)  BMI 29.04 kg/m2  SpO2 99% Physical Exam  Constitutional: He is oriented to person, place, and time. He appears well-developed and well-nourished.  HENT:  Head: Normocephalic and  atraumatic.  Right Ear: External ear normal.  Left Ear: External ear normal.  Nose: Nose normal.  Eyes: Right eye exhibits no discharge. Left eye exhibits no discharge.  Neck: Neck supple.  Cardiovascular: Normal rate, regular rhythm, normal heart sounds and intact distal pulses.   Pulses:      Dorsalis pedis pulses are 2+ on the right side, and 2+ on the left side.  Pulmonary/Chest: Effort normal and breath sounds normal.  Abdominal: Soft. He exhibits no distension. There is no tenderness.  Musculoskeletal: He exhibits no edema.       Lumbar back: He exhibits tenderness  and bony tenderness.  Diffuse lumbar tenderness. No stepoffs.  Neurological: He is alert and oriented to person, place, and time.  Reflex Scores:      Patellar reflexes are 2+ on the right side and 2+ on the left side.      Achilles reflexes are 2+ on the right side and 2+ on the left side. Skin: Skin is warm and dry.  Nursing note and vitals reviewed.   ED Course  Procedures (including critical care time) Labs Review Labs Reviewed - No data to display  Imaging Review Dg Lumbar Spine Complete  08/02/2015  CLINICAL DATA:  49 year old with 3 day history of low back pain radiating into both lower extremities. No recent injuries. EXAM: LUMBAR SPINE - COMPLETE 4+ VIEW COMPARISON:  Bone window images from CT abdomen and pelvis 08/17/2014. Lumbar spine x-rays 06/17/2013. The FINDINGS: Five non rib-bearing lumbar vertebrae with anatomic posterior alignment. No fractures. Straightening of the usual lumbar lordosis. Mild disc space narrowing and endplate hypertrophic changes at L4-5 and L3-4, unchanged. No pars defects. Mild facet degenerative changes at L5-S1. Sacroiliac joints intact. 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 Aortoiliac atherosclerosis without aneurysm. IMPRESSION: 1. No acute osseous abnormality. 2. Straightening of the usual lordosis which may reflect positioning and/or spasm. 3. Mild degenerative disc disease and spondylosis at L3-4 L4-5, stable since 2014. 4. Aortoiliac atherosclerosis, somewhat advanced for patient age. Electronically Signed   By: Hulan Saas M.D.   On: 08/02/2015 10:17   I have personally reviewed and evaluated these images and lab results as part of my medical decision-making.   EKG Interpretation None      MDM   Final diagnoses:  Bilateral low back pain, with sciatica presence unspecified    49 year old male with atraumatic low back pain. X-ray shows no acute bony abnormality. Has some radiation of pain down to his knee but  his neuro exam is normal. He is able to get up and ambulate although with a mild limp due to the amount of pain. He is hypertensive but with no abdominal pain, normal pulses, and a similar episode a couple years ago have very low suspicion for AAA. I discussed the importance of better blood pressure control and he feels like he can follow-up closely with his PCP and thus he can get labs evaluated there. No signs of a hypertensive emergency. Discussed strict return precautions.    Pricilla Loveless, MD 08/02/15 [phone]

## 2015-09-27 ENCOUNTER — Emergency Department (HOSPITAL_BASED_OUTPATIENT_CLINIC_OR_DEPARTMENT_OTHER)
Admission: EM | Admit: 2015-09-27 | Discharge: 2015-09-27 | Disposition: A | Discharge status: Other Acute Inpatient Hospital | Payer: Medicaid Other | Attending: Emergency Medicine | Admitting: Emergency Medicine

## 2015-09-27 ENCOUNTER — Encounter (HOSPITAL_BASED_OUTPATIENT_CLINIC_OR_DEPARTMENT_OTHER): Payer: Self-pay | Admitting: *Deleted

## 2015-09-27 ENCOUNTER — Emergency Department (HOSPITAL_BASED_OUTPATIENT_CLINIC_OR_DEPARTMENT_OTHER): Payer: Medicaid Other

## 2015-09-27 DIAGNOSIS — R079 Chest pain, unspecified: Secondary | ICD-10-CM | POA: Diagnosis not present

## 2015-09-27 DIAGNOSIS — I10 Essential (primary) hypertension: Secondary | ICD-10-CM | POA: Diagnosis not present

## 2015-09-27 DIAGNOSIS — F17210 Nicotine dependence, cigarettes, uncomplicated: Secondary | ICD-10-CM | POA: Insufficient documentation

## 2015-09-27 DIAGNOSIS — R7989 Other specified abnormal findings of blood chemistry: Secondary | ICD-10-CM | POA: Insufficient documentation

## 2015-09-27 DIAGNOSIS — Z792 Long term (current) use of antibiotics: Secondary | ICD-10-CM | POA: Insufficient documentation

## 2015-09-27 DIAGNOSIS — Z8673 Personal history of transient ischemic attack (TIA), and cerebral infarction without residual deficits: Secondary | ICD-10-CM | POA: Insufficient documentation

## 2015-09-27 DIAGNOSIS — J069 Acute upper respiratory infection, unspecified: Secondary | ICD-10-CM | POA: Diagnosis not present

## 2015-09-27 DIAGNOSIS — R05 Cough: Secondary | ICD-10-CM | POA: Diagnosis present

## 2015-09-27 DIAGNOSIS — Z79899 Other long term (current) drug therapy: Secondary | ICD-10-CM | POA: Diagnosis not present

## 2015-09-27 DIAGNOSIS — E785 Hyperlipidemia, unspecified: Secondary | ICD-10-CM | POA: Diagnosis not present

## 2015-09-27 DIAGNOSIS — Z791 Long term (current) use of non-steroidal anti-inflammatories (NSAID): Secondary | ICD-10-CM | POA: Diagnosis not present

## 2015-09-27 DIAGNOSIS — E119 Type 2 diabetes mellitus without complications: Secondary | ICD-10-CM | POA: Insufficient documentation

## 2015-09-27 DIAGNOSIS — R778 Other specified abnormalities of plasma proteins: Secondary | ICD-10-CM

## 2015-09-27 LAB — BASIC METABOLIC PANEL
ANION GAP: 10 (ref 5–15)
BUN: 22 mg/dL — AB (ref 6–20)
CHLORIDE: 102 mmol/L (ref 101–111)
CO2: 26 mmol/L (ref 22–32)
Calcium: 9.4 mg/dL (ref 8.9–10.3)
Creatinine, Ser: 1.14 mg/dL (ref 0.61–1.24)
Glucose, Bld: 104 mg/dL — ABNORMAL HIGH (ref 65–99)
POTASSIUM: 3.9 mmol/L (ref 3.5–5.1)
SODIUM: 138 mmol/L (ref 135–145)

## 2015-09-27 LAB — CBC WITH DIFFERENTIAL/PLATELET
BASOS ABS: 0.1 10*3/uL (ref 0.0–0.1)
BASOS PCT: 1 %
Eosinophils Absolute: 0.1 10*3/uL (ref 0.0–0.7)
Eosinophils Relative: 1 %
HCT: 46.6 % (ref 39.0–52.0)
HEMOGLOBIN: 15.2 g/dL (ref 13.0–17.0)
LYMPHS PCT: 30 %
Lymphs Abs: 2.5 10*3/uL (ref 0.7–4.0)
MCH: 21.6 pg — AB (ref 26.0–34.0)
MCHC: 32.6 g/dL (ref 30.0–36.0)
MCV: 66.2 fL — ABNORMAL LOW (ref 78.0–100.0)
Monocytes Absolute: 0.6 10*3/uL (ref 0.1–1.0)
Monocytes Relative: 7 %
Neutro Abs: 5.1 10*3/uL (ref 1.7–7.7)
Neutrophils Relative %: 61 %
PLATELETS: 348 10*3/uL (ref 150–400)
RBC: 7.04 MIL/uL — ABNORMAL HIGH (ref 4.22–5.81)
RDW: 18.3 % — ABNORMAL HIGH (ref 11.5–15.5)
WBC: 8.4 10*3/uL (ref 4.0–10.5)

## 2015-09-27 LAB — TROPONIN I: TROPONIN I: 0.08 ng/mL — AB (ref <0.031)

## 2015-09-27 MED ORDER — ASPIRIN 81 MG PO CHEW
324.0000 mg | CHEWABLE_TABLET | Freq: Once | ORAL | Status: AC
  Administered 2015-09-27: 324 mg via ORAL
  Filled 2015-09-27: qty 4

## 2015-09-27 NOTE — ED Notes (Signed)
Due to troponin results, pt placed on cont cardiac monitor with int NBP and cont POX monitor

## 2015-09-27 NOTE — ED Provider Notes (Signed)
CSN: [id number]     Arrival date & time 09/27/15  8413 History  By signing my name below, I, Sonum Patel, attest that this documentation has been prepared under the direction and in the presence of Glynn Octave, MD. Electronically Signed: Sonum Patel, Neurosurgeon. 09/27/2015. 8:34 AM.    Chief Complaint  Patient presents with  . Cough    The history is provided by the patient and a parent. No language interpreter was used.    LEVEL 5 CAVEAT: Nonverbal  HPI Comments: Jovi Zavadil is a 50 y.o. male with past medical history of HLD, HTN, DM, CVA who presents to the Emergency Department complaining of 3-4 days of  Intermittent, non-productive cough with associated sneezing and chest pain. He reports 1 episode of posttussive vomiting, upper back pain, and abdominal pain. He denies history of asthma or COPD. He denies fever, SOB, sore throat, HA, diarrhea. He is a 0.5 ppd smoker. Mother states his CVA was about 1.5 years ago and has some residual right sided deficits.   PCP Synetta Fail  Past Medical History  Diagnosis Date  . Hypertension   . Hyperlipemia   . Stroke (HCC)   . Diabetes mellitus without complication [facility])    Past Surgical History  Procedure Laterality Date  . Wrist surgery     No family history on file. Social History  Substance Use Topics  . Smoking status: Current Every Day Smoker -- 0.50 packs/day    Types: Cigarettes  . Smokeless tobacco: None  . Alcohol Use: No    Review of Systems  Unable to perform ROS: Patient nonverbal    Allergies  Review of patient's allergies indicates no known allergies.  Home Medications   Prior to Admission medications   Medication Sig Start Date End Date Taking? Authorizing Provider  atorvastatin (LIPITOR) 40 MG tablet Take 40 mg by mouth daily.   Yes Historical Provider, MD  cyclobenzaprine (FLEXERIL) 10 MG tablet Take 1 tablet (10 mg total) by mouth 3 (three) times daily as needed for muscle spasms. 08/02/15  Yes Pricilla Loveless,  MD  hydrochlorothiazide (HYDRODIURIL) 25 MG tablet Take 1 tablet (25 mg total) by mouth daily. 08/17/14  Yes Geoffery Lyons, MD  lisinopril (PRINIVIL,ZESTRIL) 10 MG tablet Take 1 tablet (10 mg total) by mouth daily. 08/17/14  Yes Geoffery Lyons, MD  metFORMIN (GLUCOPHAGE) 1000 MG tablet Take 1,000 mg by mouth 2 (two) times daily with a meal.   Yes Historical Provider, MD  verapamil (VERELAN PM) 120 MG 24 hr capsule Take 1 capsule (120 mg total) by mouth at bedtime. 08/02/12  Yes Rolan Bucco, MD  acetaminophen (TYLENOL) 500 MG tablet Take 1,000 mg by mouth every 6 (six) hours as needed. Patient used this medication for his toothache.    Historical Provider, MD  amoxicillin (AMOXIL) 500 MG capsule Take 1 capsule (500 mg total) by mouth 3 (three) times daily. 08/13/12   Elson Areas, PA-C  HYDROcodone-acetaminophen (NORCO) 5-325 MG per tablet Take 1-2 tablets by mouth every 6 (six) hours as needed. 08/17/14   Geoffery Lyons, MD  ibuprofen (ADVIL,MOTRIN) 600 MG tablet Take 1 tablet (600 mg total) by mouth every 8 (eight) hours as needed. 08/02/15   Pricilla Loveless, MD  naproxen (NAPROSYN) 500 MG tablet Take 1 tablet (500 mg total) by mouth 2 (two) times daily. 06/17/13   Robyn M Hess, PA-C  oxyCODONE-acetaminophen (PERCOCET) 5-325 MG tablet Take 1-2 tablets by mouth every 6 (six) hours as needed for severe pain. 08/02/15  Pricilla Loveless, MD  traMADol (ULTRAM) 50 MG tablet Take 1 tablet (50 mg total) by mouth every 6 (six) hours as needed for pain. 08/13/12   Elson Areas, PA-C  VERAPAMIL HCL PO Take by mouth.    Historical Provider, MD   BP 125/99 mmHg  Pulse 98  Temp(Src) 97.7 F (36.5 C) (Oral)  Resp 18  SpO2 97% Physical Exam  Constitutional: He is oriented to person, place, and time. He appears well-developed and well-nourished. No distress.  Holds his hand to chest when asked about pain  HENT:  Head: Normocephalic and atraumatic.  Mouth/Throat: Oropharynx is clear and moist. No  oropharyngeal exudate.  Eyes: Conjunctivae and EOM are normal. Pupils are equal, round, and reactive to light.  Neck: Normal range of motion. Neck supple.  No meningismus.  Cardiovascular: Normal rate, regular rhythm, normal heart sounds and intact distal pulses.   No murmur heard. Equal radial pulses and grip strengths  Pulmonary/Chest: Effort normal and breath sounds normal. No respiratory distress. He has no wheezes. He has no rales. He exhibits tenderness.  Abdominal: Soft. There is no tenderness. There is no rebound and no guarding.  Musculoskeletal: Normal range of motion. He exhibits no edema or tenderness.  Neurological: He is alert and oriented to person, place, and time. No cranial nerve deficit. He exhibits normal muscle tone. Coordination normal.  No ataxia on finger to nose bilaterally. No pronator drift. 5/5 strength throughout. CN 2-12 intact.Equal grip strength. Sensation intact.   Skin: Skin is warm.  Psychiatric: He has a normal mood and affect. His behavior is normal.  Nursing note and vitals reviewed.   ED Course  Procedures (including critical care time)  DIAGNOSTIC STUDIES: Oxygen Saturation is 98% on RA, normal by my interpretation.    COORDINATION OF CARE: 8:42 AM Will order CXR. Discussed treatment plan with pt at bedside and pt agreed to plan.  9:54 AM Patient rechecked. Updated on lab results and need for hospitalization.    Labs Review Labs Reviewed  CBC WITH DIFFERENTIAL/PLATELET - Abnormal; Notable for the following:    RBC 7.04 (*)    MCV 66.2 (*)    MCH 21.6 (*)    RDW 18.3 (*)    All other components within normal limits  BASIC METABOLIC PANEL - Abnormal; Notable for the following:    Glucose, Bld 104 (*)    BUN 22 (*)    All other components within normal limits  TROPONIN I - Abnormal; Notable for the following:    Troponin I 0.08 (*)    All other components within normal limits    Imaging Review Dg Chest 2 View  09/27/2015  CLINICAL  DATA:  Nonproductive cough. EXAM: CHEST  2 VIEW COMPARISON:  June 17, 2013. FINDINGS: The heart size and mediastinal contours are within normal limits. Both lungs are clear. No pneumothorax or pleural effusion is noted. Anterior osteophyte formation is noted in lower thoracic spine. IMPRESSION: No active cardiopulmonary disease. Electronically Signed   By: Lupita Raider, M.D.   On: 09/27/2015 09:22   I have personally reviewed and evaluated these images as part of my medical decision-making.   EKG Interpretation   Date/Time:  Saturday September 27 2015 08:49:04 EST Ventricular Rate:  96 PR Interval:  188 QRS Duration: 103 QT Interval:  363 QTC Calculation: 459 R Axis:   -77 Text Interpretation:  Sinus rhythm Left atrial enlargement Left anterior  fascicular block Anterior infarct, old No significant change was found  Confirmed by Manus Gunning  MD, Marelly Wehrman [phone]) on 09/27/2015 8:55:46 AM      MDM   Final diagnoses:  URI (upper respiratory infection)  Elevated troponin  Chest pain, unspecified chest pain type   4 days of cough and sneezing and chest soreness with coughing.  Level 5 caveat due to aphasia.  When asked where he is hurting, patient holds his chest. EKG shows unchanged LVH.  EKG unchanged LVH. CXR negative.  There is minimal troponin elevation of 0.08. Aspirin given. Mother reports patient has no cardiologist. Discussed with [facility] hospitalist who requests cardiology evaluation. Discussed with cardiology Dr. Rhona Leavens who states he will consult recommends hospitalist admission. Does not recommend heparin.  Equal upper extremity blood pressures and grip strengths. Doubt aortic dissection. No hypoxia or tachycardia, doubt PE.  Chest pain is somewhat reproducible.  D/w Dr. Harvie Junior. He accepts patient to [facility] telemetry.  I personally performed the services described in this documentation, which was scribed in my presence. The recorded information has been reviewed  and is accurate.   Glynn Octave, MD 09/27/15 1052

## 2015-09-27 NOTE — ED Notes (Signed)
PT DRINKING HOT COFFEE UPON ENTERING ROOM, ORAL TEMP NOT ASSESSED AT THIS TIME. ASK PT NOT TO DRINK FOR 

## 2015-09-27 NOTE — ED Notes (Signed)
MD at bedside. 

## 2015-09-27 NOTE — ED Notes (Signed)
Has had a non-prod cough for the past 3 days, also having some body aches

## 2015-09-27 NOTE — ED Notes (Signed)
Phone call received from [facility] Transport Team, states pt will be tx to room 710 at [facility], states will rec report upon arrival to [facility]

## 2015-09-27 NOTE — ED Notes (Signed)
Patient transported to X-ray 

## 2015-09-27 NOTE — ED Notes (Signed)
Pt with [facility] transport team members

## 2017-11-21 IMAGING — DX DG CHEST 2V
2 series · 2 of 2 positions shown · non-contrast
Comparison: June 17, 2013.

CLINICAL DATA: Nonproductive cough.

EXAM:
CHEST  2 VIEW

[chest pa]
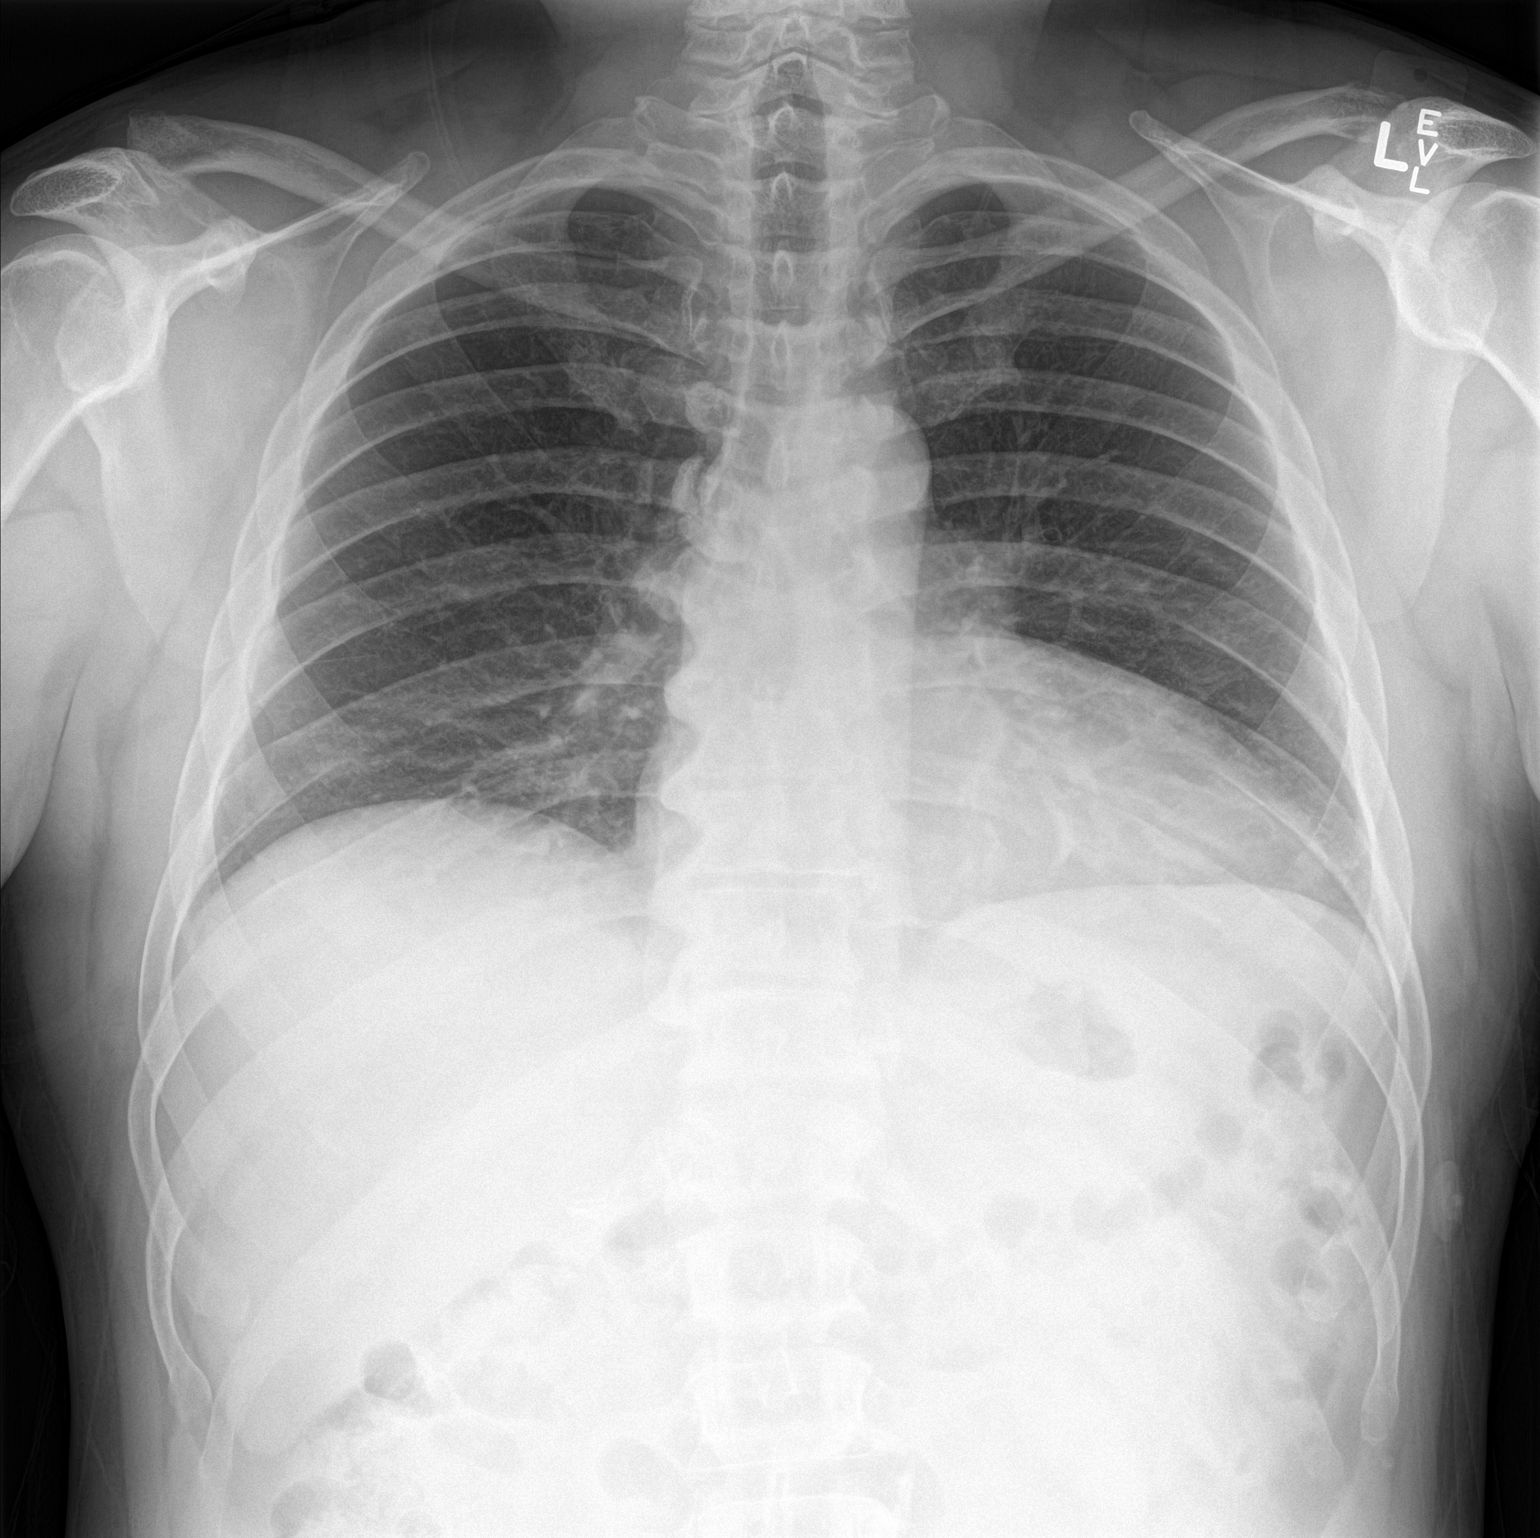

[chest lat]
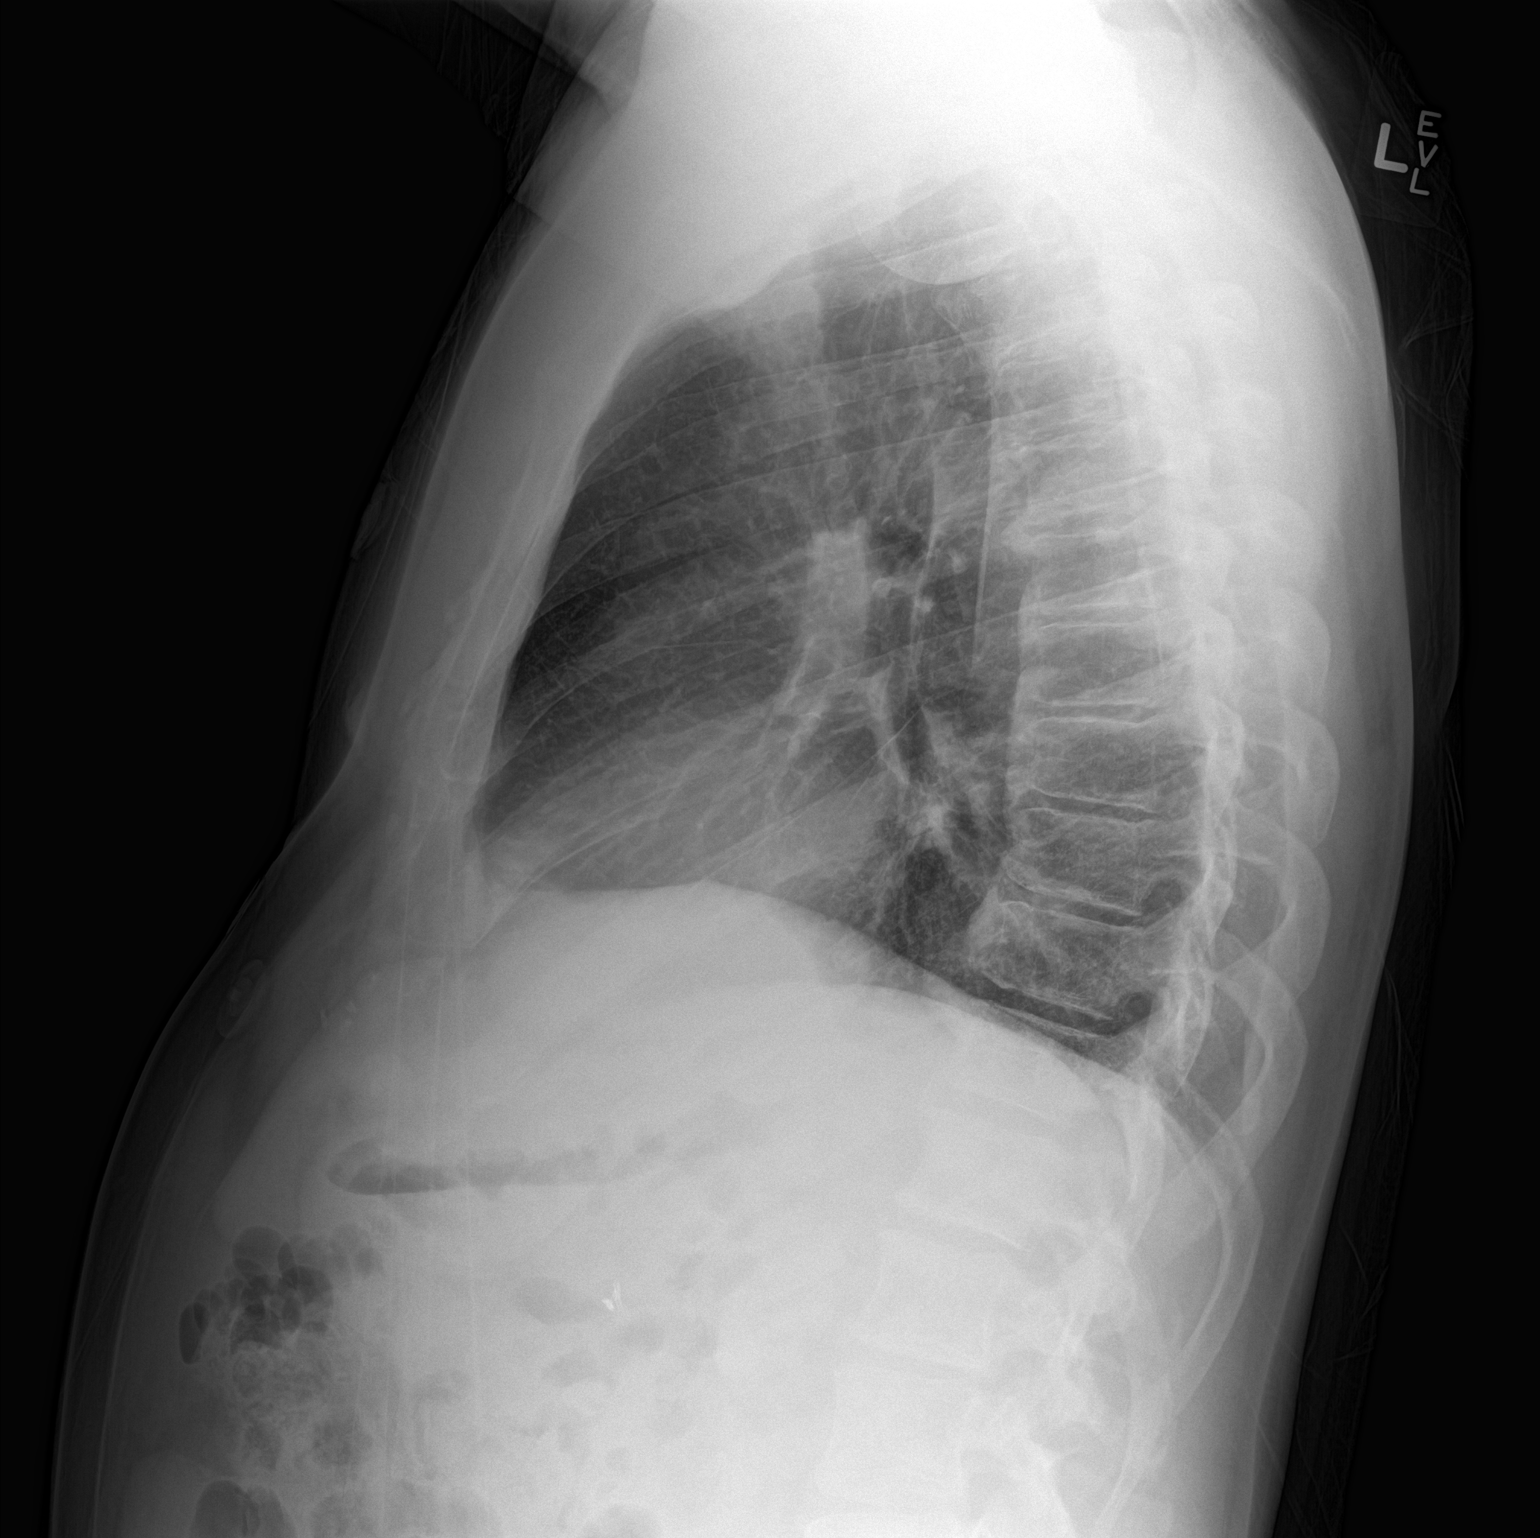

[2 of 2 positions shown; findings below may reference images not displayed]

FINDINGS: The heart size and mediastinal contours are within normal limits.
Both lungs are clear. No pneumothorax or pleural effusion is noted.
Anterior osteophyte formation is noted in lower thoracic spine.
IMPRESSION: No active cardiopulmonary disease.

## 2017-12-16 ENCOUNTER — Emergency Department (HOSPITAL_COMMUNITY)
Admission: EM | Admit: 2017-12-16 | Discharge: 2017-12-16 | Disposition: A | Discharge status: Home / Self Care | Payer: Medicaid Other | Attending: Emergency Medicine | Admitting: Emergency Medicine

## 2017-12-16 ENCOUNTER — Other Ambulatory Visit: Payer: Self-pay

## 2017-12-16 ENCOUNTER — Encounter (HOSPITAL_COMMUNITY): Payer: Self-pay

## 2017-12-16 DIAGNOSIS — E119 Type 2 diabetes mellitus without complications: Secondary | ICD-10-CM | POA: Insufficient documentation

## 2017-12-16 DIAGNOSIS — I10 Essential (primary) hypertension: Secondary | ICD-10-CM | POA: Diagnosis not present

## 2017-12-16 DIAGNOSIS — K047 Periapical abscess without sinus: Secondary | ICD-10-CM | POA: Insufficient documentation

## 2017-12-16 DIAGNOSIS — Z79899 Other long term (current) drug therapy: Secondary | ICD-10-CM | POA: Insufficient documentation

## 2017-12-16 DIAGNOSIS — H6122 Impacted cerumen, left ear: Secondary | ICD-10-CM | POA: Insufficient documentation

## 2017-12-16 DIAGNOSIS — F17210 Nicotine dependence, cigarettes, uncomplicated: Secondary | ICD-10-CM | POA: Diagnosis not present

## 2017-12-16 DIAGNOSIS — Z7984 Long term (current) use of oral hypoglycemic drugs: Secondary | ICD-10-CM | POA: Insufficient documentation

## 2017-12-16 DIAGNOSIS — H6692 Otitis media, unspecified, left ear: Secondary | ICD-10-CM | POA: Diagnosis not present

## 2017-12-16 DIAGNOSIS — K0889 Other specified disorders of teeth and supporting structures: Secondary | ICD-10-CM | POA: Diagnosis present

## 2017-12-16 MED ORDER — AMOXICILLIN 500 MG PO CAPS: 500.0000 mg | ORAL_CAPSULE | Freq: Three times a day (TID) | ORAL | 0 refills | Status: AC

## 2017-12-16 MED ORDER — NEOMYCIN-COLIST-HC-THONZONIUM 3.3-3-10-0.5 MG/ML OT SUSP
4.0000 [drp] | Freq: Four times a day (QID) | OTIC | Status: DC
  Administered 2017-12-16: 4 [drp] via OTIC
  Filled 2017-12-16: qty 10

## 2017-12-16 NOTE — Discharge Instructions (Addendum)
Call the dentist Dr. Michiel SitesKoelling on Monday morning and tell them you were here for an infected decayed tooth and need follow up.  Use the ear drops in the left ear 4 drops every 6 hours. Follow up with your doctor next week to be sure the infection is improving

## 2017-12-16 NOTE — ED Provider Notes (Signed)
[facility]-EMERGENCY DEPT Provider Note   CSN: [id number] Arrival date & time: 12/16/17  1521     History   Chief Complaint Chief Complaint  Patient presents with  . Dental Pain    L  . Otalgia    L    HPI Gary Coleman is a 52 y.o. male who presents to the ED with dental pain and ear pain. The dental pain is located in the left upper dental area and the ear discomfort is on the left. Patient reports he can no hear out of his left ear and it feels stopped up.   HPI  Past Medical History:  Diagnosis Date  . Diabetes mellitus without complication (HCC)   . Hyperlipemia   . Hypertension   . Stroke [facility])     There are no active problems to display for this patient.   Past Surgical History:  Procedure Laterality Date  . WRIST SURGERY          Home Medications    Prior to Admission medications   Medication Sig Start Date End Date Taking? Authorizing Provider  acetaminophen (TYLENOL) 500 MG tablet Take 1,000 mg by mouth every 6 (six) hours as needed. Patient used this medication for his toothache.    [provider]  amoxicillin (AMOXIL) 500 MG capsule Take 1 capsule (500 mg total) by mouth 3 (three) times daily. 12/16/17   Janne Napoleon, NP  atorvastatin (LIPITOR) 40 MG tablet Take 40 mg by mouth daily.    [provider]  cyclobenzaprine (FLEXERIL) 10 MG tablet Take 1 tablet (10 mg total) by mouth 3 (three) times daily as needed for muscle spasms. 08/02/15   Pricilla Loveless, MD  hydrochlorothiazide (HYDRODIURIL) 25 MG tablet Take 1 tablet (25 mg total) by mouth daily. 08/17/14   Geoffery Lyons, MD  HYDROcodone-acetaminophen (NORCO) 5-325 MG per tablet Take 1-2 tablets by mouth every 6 (six) hours as needed. 08/17/14   Geoffery Lyons, MD  ibuprofen (ADVIL,MOTRIN) 600 MG tablet Take 1 tablet (600 mg total) by mouth every 8 (eight) hours as needed. 08/02/15   Pricilla Loveless, MD  lisinopril (PRINIVIL,ZESTRIL) 10 MG tablet Take 1 tablet  (10 mg total) by mouth daily. 08/17/14   Geoffery Lyons, MD  metFORMIN (GLUCOPHAGE) 1000 MG tablet Take 1,000 mg by mouth 2 (two) times daily with a meal.    [provider]  naproxen (NAPROSYN) 500 MG tablet Take 1 tablet (500 mg total) by mouth 2 (two) times daily. 06/17/13   Hess, Nada Boozer, PA-C  oxyCODONE-acetaminophen (PERCOCET) 5-325 MG tablet Take 1-2 tablets by mouth every 6 (six) hours as needed for severe pain. 08/02/15   Pricilla Loveless, MD  traMADol (ULTRAM) 50 MG tablet Take 1 tablet (50 mg total) by mouth every 6 (six) hours as needed for pain. 08/13/12   Elson Areas, PA-C  verapamil (VERELAN PM) 120 MG 24 hr capsule Take 1 capsule (120 mg total) by mouth at bedtime. 08/02/12   Rolan Bucco, MD  VERAPAMIL HCL PO Take by mouth.    [provider]    Family History History reviewed. No pertinent family history.  Social History Social History   Tobacco Use  . Smoking status: Current Every Day Smoker    Packs/day: 0.50    Types: Cigarettes  Substance Use Topics  . Alcohol use: No  . Drug use: No     Allergies   Patient has no known allergies.   Review of Systems Review of Systems  Constitutional: Negative for fever.  HENT: Positive for dental problem and ear pain. Negative for sore throat.   Eyes: Negative for redness.  Respiratory: Negative for cough.   Gastrointestinal: Negative for nausea and vomiting.  Musculoskeletal: Negative for neck stiffness.  Skin: Negative for rash.  Neurological: Negative for headaches.  Hematological: Positive for adenopathy.  Psychiatric/Behavioral: Negative for confusion.     Physical Exam Updated Vital Signs BP (!) 167/109 (BP Location: Right Arm)   Pulse 81   Temp 98.9 F (37.2 C) (Oral)   Resp 16   SpO2 100%   Physical Exam  Constitutional: He appears well-developed and well-nourished. No distress.  HENT:  Head: Normocephalic.  Right Ear: Tympanic membrane normal.  Mouth/Throat: Oropharynx is  clear and moist. Dental caries present.    Left TM no visualized due to cerumen.    Eyes: Pupils are equal, round, and reactive to light. Conjunctivae and EOM are normal.  Neck: Normal range of motion. Neck supple.  Cardiovascular: Normal rate.  Pulmonary/Chest: Effort normal.  Musculoskeletal: Normal range of motion.  Lymphadenopathy:    He has cervical adenopathy.  Neurological: He is alert.  Skin: Skin is warm and dry.  Psychiatric: He has a normal mood and affect.  Nursing note and vitals reviewed.    ED Treatments / Results  Labs (all labs ordered are listed, but only abnormal results are displayed) Labs Reviewed - No data to display  EKG None  Radiology No results found.  Procedures Irrigation Date/Time: 12/16/2017 8:24 PM Performed by: Janne NapoleonNeese, Tashon Capp M, NP Authorized by: Janne NapoleonNeese, Shamicka Inga M, NP  Consent: Verbal consent obtained. Risks and benefits: risks, benefits and alternatives were discussed Consent given by: patient Patient understanding: patient states understanding of the procedure being performed Required items: required blood products, implants, devices, and special equipment available Patient identity confirmed: verbally with patient Preparation: Patient was prepped and draped in the usual sterile fashion. Local anesthesia used: no  Anesthesia: Local anesthesia used: no  Sedation: Patient sedated: no  Patient tolerance: Patient tolerated the procedure well with no immediate complications Comments: Irrigation of the left ear for cerumen impaction with good results. Large amount of cerumen removed. Re examined after irrigation. TM with erythema, canal swollen. Will treat with cortisporin Otic Susp.     (including critical care time)  Medications Ordered in ED Medications  neomycin-colistin-hydrocortisone-thonzonium (CORTISPORIN TC) OTIC (EAR) suspension 4 drop (4 drops Left EAR Given 12/16/17 1945)     Initial Impression / Assessment and Plan / ED  Course  I have reviewed the triage vital signs and the nursing notes. Patient with toothache.  No gross abscess.  Exam unconcerning for Ludwig's angina or spread of infection.  Will treat with penicillin and anti-inflammatories medicine.  Urged patient to follow-up with dentist.  Patient also with cerumen impaction left ear and after removed infection noted. Will treat for otitis media and patient to f/u with PCP next week or return here sooner for problems.   Final Clinical Impressions(s) / ED Diagnoses   Final diagnoses:  Dental infection  Impacted cerumen of left ear  Acute bacterial otitis media, left    ED Discharge Orders        Ordered    amoxicillin (AMOXIL) 500 MG capsule  3 times daily     12/16/17 1909       Kerrie Buffaloeese, Markesha Hannig [place], NP 12/16/17 2027    Doug SouJacubowitz, Sam, MD 12/17/17 Moses [id number]

## 2017-12-16 NOTE — ED Triage Notes (Signed)
Pt BIB [facility] and is deaf. He does not know sign language. EMS was called by a bystander because pt "looked to be in pain." L dental pain and L sided otalgia endorsed by patient.

## 2017-12-16 NOTE — ED Notes (Signed)
Pt had elevated BP and pt stated it is d/t dental pain and refused to be further evaluated.

## 2017-12-16 NOTE — ED Notes (Addendum)
Pt is is unable to  hear  out of left ear d/t wax build-up . Pt reports that he has pain in his upper let molar that appears to be decayed and radiates to left ear 8/10 pain throbbing and has had pain for 3 days .

## 2017-12-16 NOTE — ED Notes (Signed)
Left ear irrigated pt reported improvement and is able to hear out of left ear.

## 2018-04-20 ENCOUNTER — Ambulatory Visit (HOSPITAL_COMMUNITY)
Admission: EM | Admit: 2018-04-20 | Discharge: 2018-04-20 | Disposition: A | Discharge status: Home / Self Care | Payer: Medicaid Other | Attending: Family Medicine | Admitting: Family Medicine

## 2018-04-20 ENCOUNTER — Encounter (HOSPITAL_COMMUNITY): Payer: Self-pay | Admitting: Emergency Medicine

## 2018-04-20 DIAGNOSIS — H1032 Unspecified acute conjunctivitis, left eye: Secondary | ICD-10-CM

## 2018-04-20 MED ORDER — GENTAMICIN SULFATE 0.3 % OP SOLN: 1.0000 [drp] | OPHTHALMIC | 0 refills | Status: AC

## 2018-04-20 MED FILL — GENTAMICIN 3 MG/ML EYE DRP: 0.3 | 16 days supply | Qty: 5 | Fill #0

## 2018-04-20 NOTE — ED Provider Notes (Signed)
[facility]    CSN: [id number] Arrival date & time: 04/20/18  4098     History   Chief Complaint Chief Complaint  Patient presents with  . Eye Problem    HPI Gary Coleman is a 52 y.o. male.   Patient has expressive aphasia but it appears that he has some redness and discharge from his left eye for the past several days.  He denies any photophobia or loss of vision or pain.  HPI  Past Medical History:  Diagnosis Date  . Diabetes mellitus without complication (HCC)   . Hyperlipemia   . Hypertension   . Stroke [facility])     There are no active problems to display for this patient.   Past Surgical History:  Procedure Laterality Date  . WRIST SURGERY         Home Medications    Prior to Admission medications   Medication Sig Start Date End Date Taking? Authorizing Provider  atorvastatin (LIPITOR) 40 MG tablet Take 40 mg by mouth daily.   Yes [provider]  hydrochlorothiazide (HYDRODIURIL) 25 MG tablet Take 1 tablet (25 mg total) by mouth daily. 08/17/14  Yes Delo, Riley Lam, MD  lisinopril (PRINIVIL,ZESTRIL) 10 MG tablet Take 1 tablet (10 mg total) by mouth daily. 08/17/14  Yes Delo, Riley Lam, MD  verapamil (VERELAN PM) 120 MG 24 hr capsule Take 1 capsule (120 mg total) by mouth at bedtime. 08/02/12  Yes Rolan Bucco, MD  acetaminophen (TYLENOL) 500 MG tablet Take 1,000 mg by mouth every 6 (six) hours as needed. Patient used this medication for his toothache.    [provider]  amoxicillin (AMOXIL) 500 MG capsule Take 1 capsule (500 mg total) by mouth 3 (three) times daily. 12/16/17   Janne Napoleon, NP  cyclobenzaprine (FLEXERIL) 10 MG tablet Take 1 tablet (10 mg total) by mouth 3 (three) times daily as needed for muscle spasms. 08/02/15   Pricilla Loveless, MD  HYDROcodone-acetaminophen (NORCO) 5-325 MG per tablet Take 1-2 tablets by mouth every 6 (six) hours as needed. 08/17/14   Geoffery Lyons, MD  ibuprofen (ADVIL,MOTRIN) 600 MG tablet Take 1  tablet (600 mg total) by mouth every 8 (eight) hours as needed. 08/02/15   Pricilla Loveless, MD  metFORMIN (GLUCOPHAGE) 1000 MG tablet Take 1,000 mg by mouth 2 (two) times daily with a meal.    [provider]  naproxen (NAPROSYN) 500 MG tablet Take 1 tablet (500 mg total) by mouth 2 (two) times daily. 06/17/13   Hess, Nada Boozer, PA-C  oxyCODONE-acetaminophen (PERCOCET) 5-325 MG tablet Take 1-2 tablets by mouth every 6 (six) hours as needed for severe pain. 08/02/15   Pricilla Loveless, MD  traMADol (ULTRAM) 50 MG tablet Take 1 tablet (50 mg total) by mouth every 6 (six) hours as needed for pain. 08/13/12   Elson Areas, PA-C  VERAPAMIL HCL PO Take by mouth.    [provider]    Family History No family history on file.  Social History Social History   Tobacco Use  . Smoking status: Current Every Day Smoker    Packs/day: 0.50    Types: Cigarettes  Substance Use Topics  . Alcohol use: No  . Drug use: No     Allergies   Patient has no known allergies.   Review of Systems Review of Systems  Constitutional: Negative for chills and fever.  HENT: Negative for ear pain and sore throat.   Eyes: Positive for discharge and redness. Negative for pain  and visual disturbance.  Respiratory: Negative for cough and shortness of breath.   Cardiovascular: Negative for chest pain and palpitations.  Gastrointestinal: Negative for abdominal pain and vomiting.  Genitourinary: Negative for dysuria and hematuria.  Musculoskeletal: Negative for arthralgias and back pain.  Skin: Negative for color change and rash.  Neurological: Negative for seizures and syncope.  All other systems reviewed and are negative.    Physical Exam Triage Vital Signs ED Triage Vitals [04/20/18 1007]  Enc Vitals Group     BP (!) 150/94     Pulse Rate 83     Resp 16     Temp 99.3 F (37.4 C)     Temp src      SpO2 100 %     Weight      Height      Head Circumference      Peak Flow      Pain  Score      Pain Loc      Pain Edu?      Excl. in GC?    No data found.  Updated Vital Signs BP (!) 150/94   Pulse 83   Temp 99.3 F (37.4 C)   Resp 16   SpO2 100%   Visual Acuity Right Eye Distance:   Left Eye Distance:   Bilateral Distance:    Right Eye Near:   Left Eye Near:    Bilateral Near:     Physical Exam  Constitutional: He appears well-developed and well-nourished.  Eyes: Pupils are equal, round, and reactive to light. Left eye exhibits discharge.  Left eye is injected. Pupil is equal and reactive.  Tactile pressures appear symmetric.     UC Treatments / Results  Labs (all labs ordered are listed, but only abnormal results are displayed) Labs Reviewed - No data to display  EKG None  Radiology No results found.  Procedures Procedures (including critical care time)  Medications Ordered in UC Medications - No data to display  Initial Impression / Assessment and Plan / UC Course  I have reviewed the triage vital signs and the nursing notes.  Pertinent labs & imaging results that were available during my care of the patient were reviewed by me and considered in my medical decision making (see chart for details).     Conjunctivitis left eye. Final Clinical Impressions(s) / UC Diagnoses   Final diagnoses:  None   Discharge Instructions   None    ED Prescriptions    None     Controlled Substance Prescriptions [place] Controlled Substance Registry consulted? No   Frederica KusterMiller, [place] M, MD 04/20/18 1023

## 2018-04-20 NOTE — ED Triage Notes (Signed)
Pt c/o L eye redness and drainage.x2 days

## 2018-05-29 ENCOUNTER — Emergency Department (HOSPITAL_COMMUNITY)
Admission: EM | Admit: 2018-05-29 | Discharge: 2018-05-29 | Disposition: A | Discharge status: Home / Self Care | Payer: Medicaid Other | Attending: Emergency Medicine | Admitting: Emergency Medicine

## 2018-05-29 ENCOUNTER — Encounter (HOSPITAL_COMMUNITY): Payer: Self-pay

## 2018-05-29 ENCOUNTER — Other Ambulatory Visit: Payer: Self-pay

## 2018-05-29 ENCOUNTER — Emergency Department (HOSPITAL_COMMUNITY): Payer: Medicaid Other

## 2018-05-29 DIAGNOSIS — R079 Chest pain, unspecified: Secondary | ICD-10-CM | POA: Insufficient documentation

## 2018-05-29 DIAGNOSIS — Z5321 Procedure and treatment not carried out due to patient leaving prior to being seen by health care provider: Secondary | ICD-10-CM | POA: Diagnosis not present

## 2018-05-29 LAB — BASIC METABOLIC PANEL
ANION GAP: 7 (ref 5–15)
BUN: 14 mg/dL (ref 6–20)
CALCIUM: 8.5 mg/dL — AB (ref 8.9–10.3)
CO2: 20 mmol/L — ABNORMAL LOW (ref 22–32)
Chloride: 106 mmol/L (ref 98–111)
Creatinine, Ser: 0.99 mg/dL (ref 0.61–1.24)
Glucose, Bld: 130 mg/dL — ABNORMAL HIGH (ref 70–99)
POTASSIUM: 3.7 mmol/L (ref 3.5–5.1)
SODIUM: 133 mmol/L — AB (ref 135–145)

## 2018-05-29 LAB — CBC
HEMATOCRIT: 49.5 % (ref 39.0–52.0)
HEMOGLOBIN: 15.1 g/dL (ref 13.0–17.0)
MCH: 22.5 pg — ABNORMAL LOW (ref 26.0–34.0)
MCHC: 30.5 g/dL (ref 30.0–36.0)
MCV: 73.7 fL — ABNORMAL LOW (ref 78.0–100.0)
Platelets: 275 10*3/uL (ref 150–400)
RBC: 6.72 MIL/uL — AB (ref 4.22–5.81)
RDW: 16.7 % — ABNORMAL HIGH (ref 11.5–15.5)
WBC: 9.2 10*3/uL (ref 4.0–10.5)

## 2018-05-29 LAB — I-STAT TROPONIN, ED: TROPONIN I, POC: 0 ng/mL (ref 0.00–0.08)

## 2018-05-29 NOTE — ED Triage Notes (Signed)
Per [facility], pt from hom. Pt endorses mid CP x 1 days non radiating, no other sx. VSS. Hx of stroke with speech deficits at baseline.

## 2018-05-29 NOTE — ED Notes (Signed)
Pt called 3X to be taken back to room; no reponse

## 2018-05-29 NOTE — ED Notes (Signed)
Called patient X2.  °

## 2021-03-25 ENCOUNTER — Emergency Department (HOSPITAL_BASED_OUTPATIENT_CLINIC_OR_DEPARTMENT_OTHER)
Admission: EM | Admit: 2021-03-25 | Discharge: 2021-03-25 | Disposition: A | Discharge status: Home / Self Care | Payer: Medicaid Other | Attending: Emergency Medicine | Admitting: Emergency Medicine

## 2021-03-25 ENCOUNTER — Other Ambulatory Visit: Payer: Self-pay

## 2021-03-25 ENCOUNTER — Emergency Department (HOSPITAL_BASED_OUTPATIENT_CLINIC_OR_DEPARTMENT_OTHER): Payer: Medicaid Other

## 2021-03-25 ENCOUNTER — Encounter (HOSPITAL_BASED_OUTPATIENT_CLINIC_OR_DEPARTMENT_OTHER): Payer: Self-pay | Admitting: *Deleted

## 2021-03-25 DIAGNOSIS — I824Y2 Acute embolism and thrombosis of unspecified deep veins of left proximal lower extremity: Secondary | ICD-10-CM

## 2021-03-25 DIAGNOSIS — I10 Essential (primary) hypertension: Secondary | ICD-10-CM | POA: Insufficient documentation

## 2021-03-25 DIAGNOSIS — Z7984 Long term (current) use of oral hypoglycemic drugs: Secondary | ICD-10-CM | POA: Insufficient documentation

## 2021-03-25 DIAGNOSIS — E119 Type 2 diabetes mellitus without complications: Secondary | ICD-10-CM | POA: Diagnosis not present

## 2021-03-25 DIAGNOSIS — F17210 Nicotine dependence, cigarettes, uncomplicated: Secondary | ICD-10-CM | POA: Diagnosis not present

## 2021-03-25 DIAGNOSIS — Z79899 Other long term (current) drug therapy: Secondary | ICD-10-CM | POA: Insufficient documentation

## 2021-03-25 DIAGNOSIS — M7989 Other specified soft tissue disorders: Secondary | ICD-10-CM | POA: Diagnosis not present

## 2021-03-25 MED ORDER — RIVAROXABAN (XARELTO) VTE STARTER PACK (15 & 20 MG): ORAL_TABLET | ORAL | 0 refills | Status: AC

## 2021-03-25 MED ORDER — RIVAROXABAN 15 MG PO TABS
15.0000 mg | ORAL_TABLET | Freq: Once | ORAL | Status: AC
  Administered 2021-03-25: 15 mg via ORAL
  Filled 2021-03-25: qty 1

## 2021-03-25 NOTE — ED Notes (Signed)
Attempted to call pt sister for ride home, no answer and unable to leave voicemail because it is full; will continue to call

## 2021-03-25 NOTE — Discharge Instructions (Addendum)
Begin taking Xarelto as prescribed.  You should follow-up with your primary doctor in the next week for a recheck, and return to the ER if you develop severe chest pain, difficulty breathing, or other new and concerning symptoms.

## 2021-03-25 NOTE — ED Triage Notes (Signed)
C/o left leg pain and swelling x "weeks "

## 2021-03-25 NOTE — ED Provider Notes (Signed)
[facility] EMERGENCY DEPARTMENT Provider Note   CSN: [id number] Arrival date & time: 03/25/21  0016     History Chief Complaint  Patient presents with   Leg Swelling    [place] is a 55 y.o. male.  Patient is a 55 year old male with history of diabetes, hypertension, hyperlipidemia, and prior CVA.  Patient presenting with a several week history of swelling and pain to his left lower leg.  He has been seen at [facility] previously during which time an ultrasound was negative.  He was treated for cellulitis with antibiotics, however this has not improved.  He denies any fevers or chills.  He denies any chest pain or difficulty breathing.  The history is provided by the patient.      Past Medical History:  Diagnosis Date   Diabetes mellitus without complication (HCC)    Hyperlipemia    Hypertension    Stroke (HCC)     There are no problems to display for this patient.   Past Surgical History:  Procedure Laterality Date   WRIST SURGERY         No family history on file.  Social History   Tobacco Use   Smoking status: Every Day    Packs/day: 0.50    Types: Cigarettes  Substance Use Topics   Alcohol use: No   Drug use: Yes    Types: Cocaine    Home Medications Prior to Admission medications   Medication Sig Start Date End Date Taking? Authorizing Provider  acetaminophen (TYLENOL) 500 MG tablet Take 1,000 mg by mouth every 6 (six) hours as needed. Patient used this medication for his toothache.    [provider]  amoxicillin (AMOXIL) 500 MG capsule Take 1 capsule (500 mg total) by mouth 3 (three) times daily. 12/16/17   Janne Napoleon, NP  atorvastatin (LIPITOR) 40 MG tablet Take 40 mg by mouth daily.    [provider]  cyclobenzaprine (FLEXERIL) 10 MG tablet Take 1 tablet (10 mg total) by mouth 3 (three) times daily as needed for muscle spasms. 08/02/15   Pricilla Loveless, MD  gentamicin (GARAMYCIN) 0.3 % ophthalmic solution Place  1 drop into the left eye every 4 (four) hours. 04/20/18   Frederica Kuster, MD  hydrochlorothiazide (HYDRODIURIL) 25 MG tablet Take 1 tablet (25 mg total) by mouth daily. 08/17/14   Geoffery Lyons, MD  HYDROcodone-acetaminophen (NORCO) 5-325 MG per tablet Take 1-2 tablets by mouth every 6 (six) hours as needed. 08/17/14   Geoffery Lyons, MD  ibuprofen (ADVIL,MOTRIN) 600 MG tablet Take 1 tablet (600 mg total) by mouth every 8 (eight) hours as needed. 08/02/15   Pricilla Loveless, MD  lisinopril (PRINIVIL,ZESTRIL) 10 MG tablet Take 1 tablet (10 mg total) by mouth daily. 08/17/14   Geoffery Lyons, MD  metFORMIN (GLUCOPHAGE) 1000 MG tablet Take 1,000 mg by mouth 2 (two) times daily with a meal.    [provider]  naproxen (NAPROSYN) 500 MG tablet Take 1 tablet (500 mg total) by mouth 2 (two) times daily. 06/17/13   Hess, Nada Boozer, PA-C  oxyCODONE-acetaminophen (PERCOCET) 5-325 MG tablet Take 1-2 tablets by mouth every 6 (six) hours as needed for severe pain. 08/02/15   Pricilla Loveless, MD  traMADol (ULTRAM) 50 MG tablet Take 1 tablet (50 mg total) by mouth every 6 (six) hours as needed for pain. 08/13/12   Elson Areas, PA-C  verapamil (VERELAN PM) 120 MG 24 hr capsule Take 1 capsule (120 mg total) by  mouth at bedtime. 08/02/12   Rolan Bucco, MD  VERAPAMIL HCL PO Take by mouth.    [provider]    Allergies    Metformin  Review of Systems   Review of Systems  All other systems reviewed and are negative.  Physical Exam Updated Vital Signs BP (!) 149/93   Pulse 100   Temp 99.1 F (37.3 C) (Oral)   Resp 16   Ht 6\' 1"  (1.854 m)   SpO2 94%   BMI 27.46 kg/m   Physical Exam Vitals and nursing note reviewed.  Constitutional:      General: He is not in acute distress.    Appearance: Normal appearance. He is not ill-appearing.  HENT:     Head: Normocephalic and atraumatic.  Pulmonary:     Effort: Pulmonary effort is normal.  Musculoskeletal:        General: Swelling  and tenderness present. Normal range of motion.     Cervical back: Normal range of motion and neck supple.     Right lower leg: No edema.     Left lower leg: Edema present.     Comments: There is swelling and edema of the left lower extremity from the calf extending through the foot.  DP pulses are palpable.  Skin:    General: Skin is warm and dry.  Neurological:     Mental Status: He is alert and oriented to person, place, and time.    ED Results / Procedures / Treatments   Labs (all labs ordered are listed, but only abnormal results are displayed) Labs Reviewed - No data to display  EKG None  Radiology No results found.  Procedures Procedures   Medications Ordered in ED Medications - No data to display  ED Course  I have reviewed the triage vital signs and the nursing notes.  Pertinent labs & imaging results that were available during my care of the patient were reviewed by me and considered in my medical decision making (see chart for details).    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  Patient to be started on Xarelto and follow-up with PCP in the next week.  He had labs recently at [facility] regional showing normal renal function.  Final Clinical Impression(s) / ED Diagnoses Final diagnoses:  None    Rx / DC Orders ED Discharge Orders     None        [facility], MD 03/25/21 [id number]

## 2021-03-25 NOTE — ED Notes (Signed)
Spoke to pt sister Steward Drone, who says "they were supposed send him to [facility]"; she says she can no longer pick him up but will try and get in touch with her son to come get pt

## 2023-02-21 DEATH — deceased

## 2023-05-20 IMAGING — US US EXTREM LOW VENOUS*L*
1 series · 14 of 24 positions shown · non-contrast
Comparison: None.

CLINICAL DATA: Left leg pain and swelling.

EXAM:
LEFT LOWER EXTREMITY VENOUS DOPPLER ULTRASOUND
TECHNIQUE: Gray-scale sonography with compression, as well as color and duplex
ultrasound, were performed to evaluate the deep venous system(s)
from the level of the common femoral vein through the popliteal and
proximal calf veins.

[Series 1: us extrem low venous*left* · 14 of 43 slices shown]
[im 1/43]
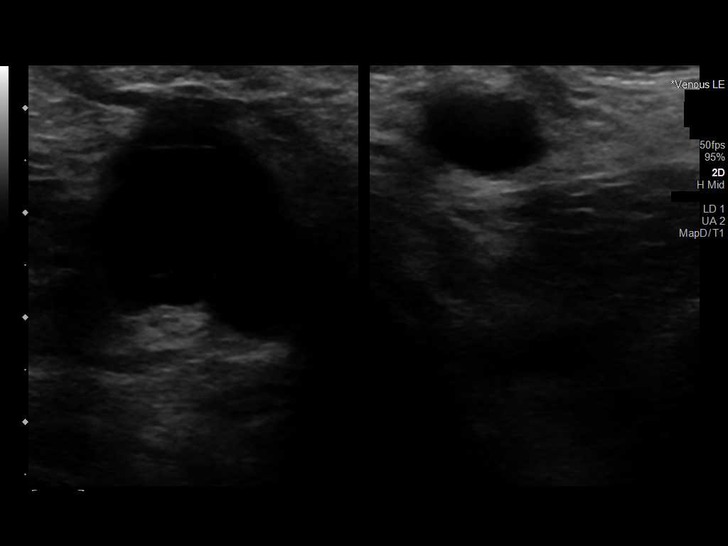
[im 4/43]
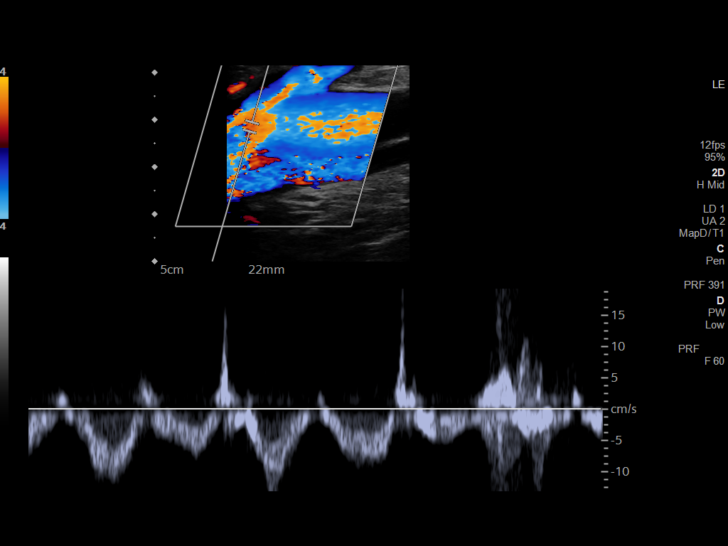
[im 8/43]
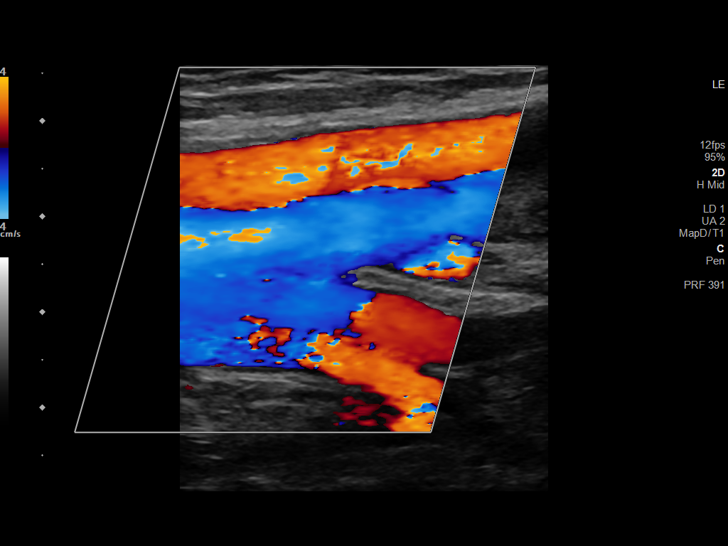
[im 11/43]
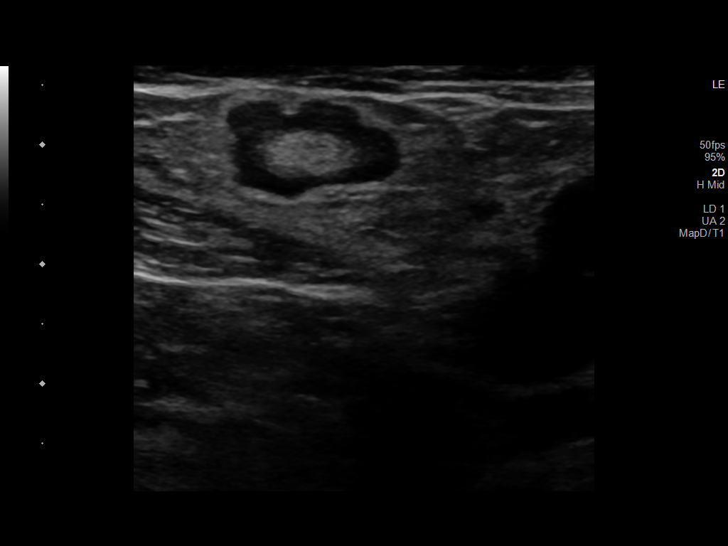
[im 13/43]
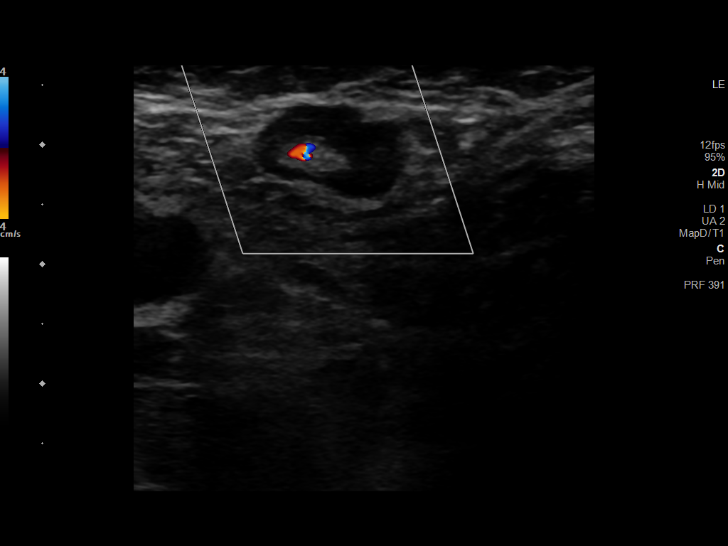
[im 17/43]
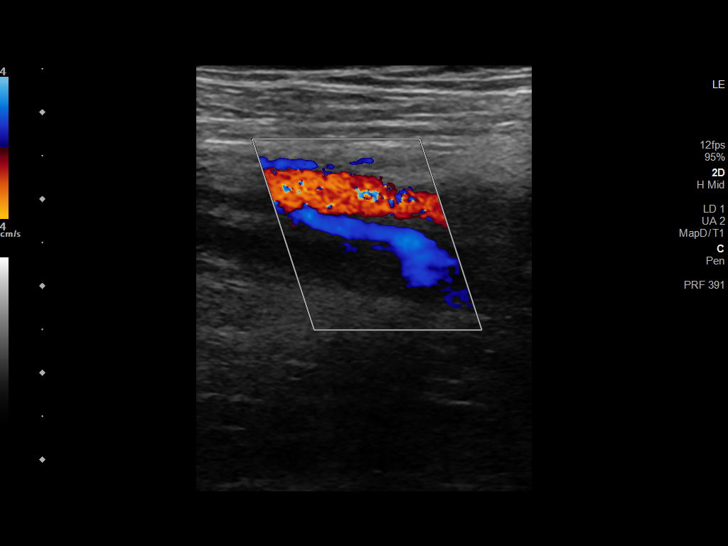
[im 21/43]
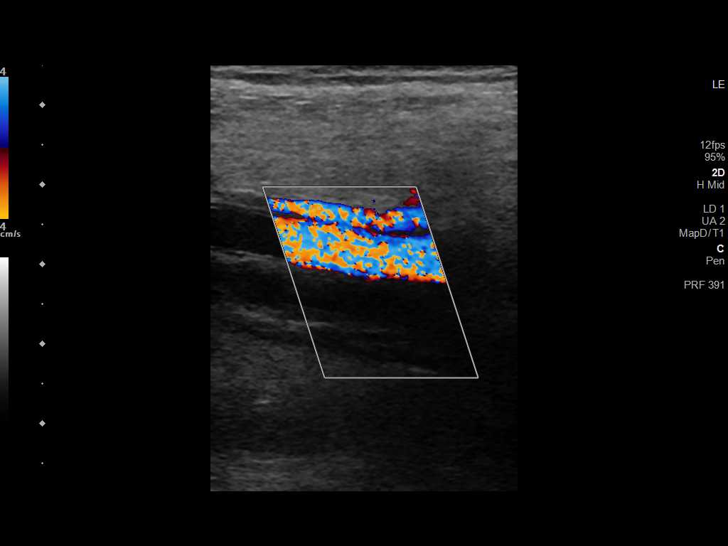
[im 22/43]
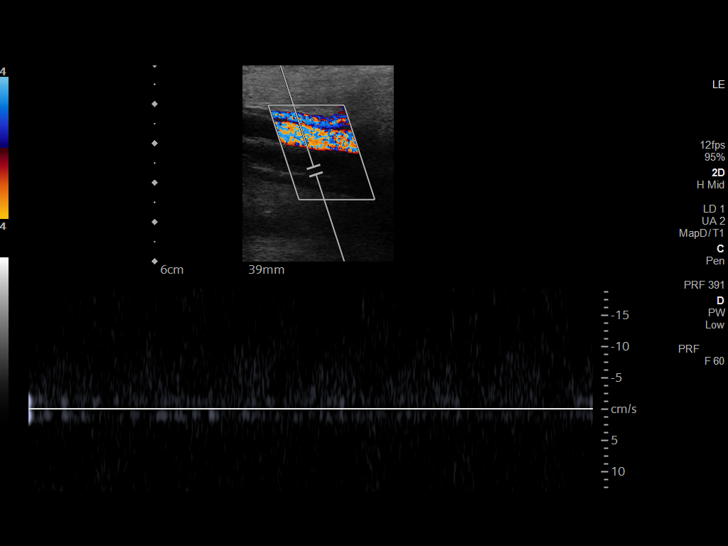
[im 26/43]
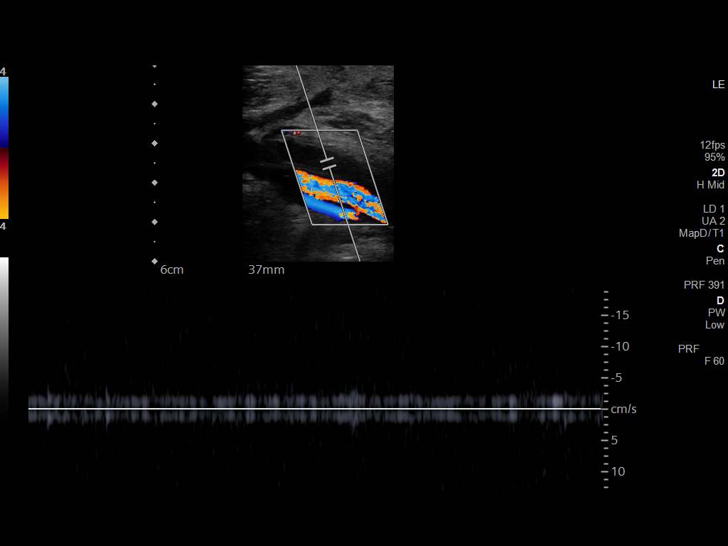
[im 30/43]
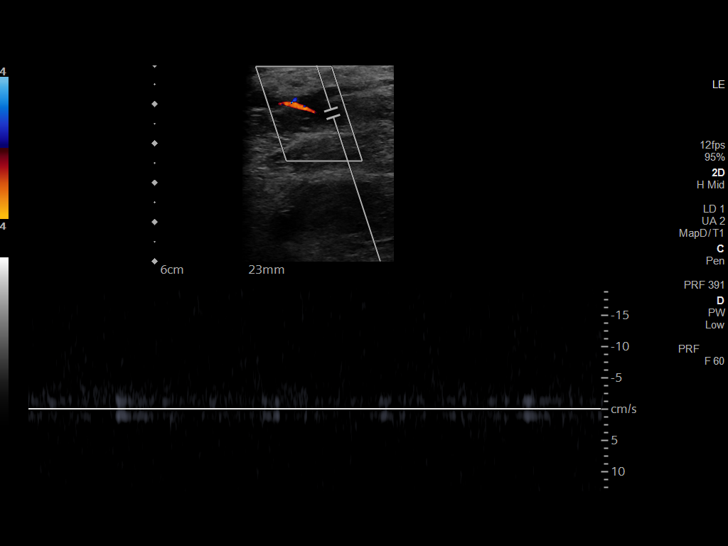
[im 33/43]
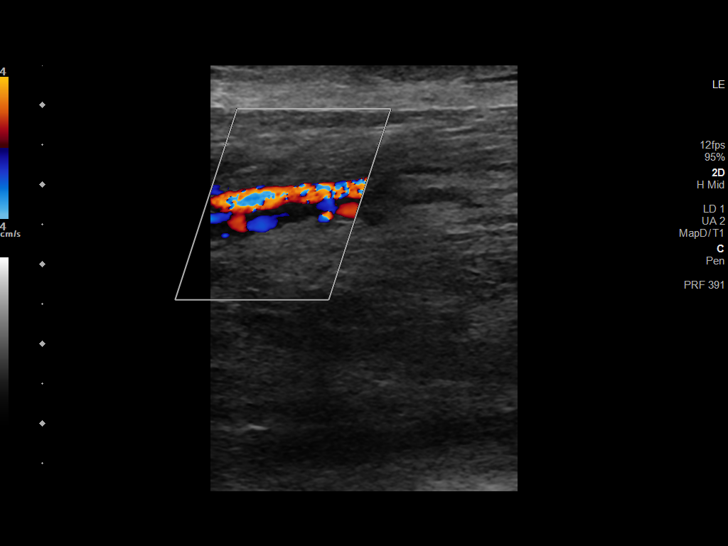
[im 35/43]
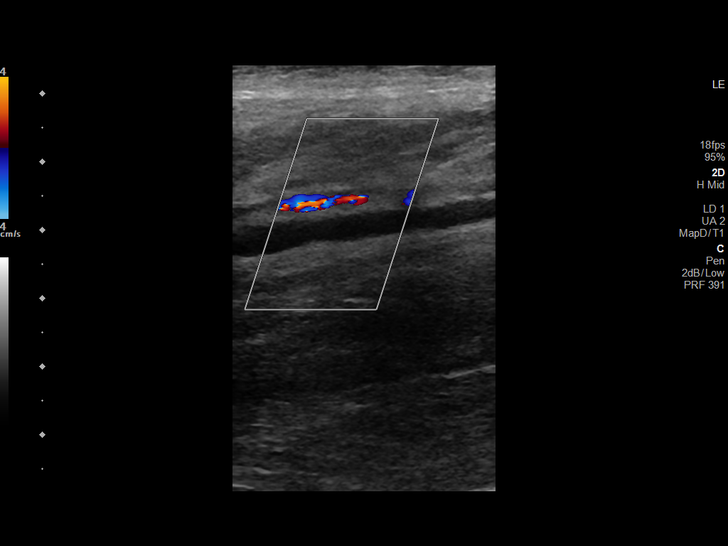
[im 39/43]
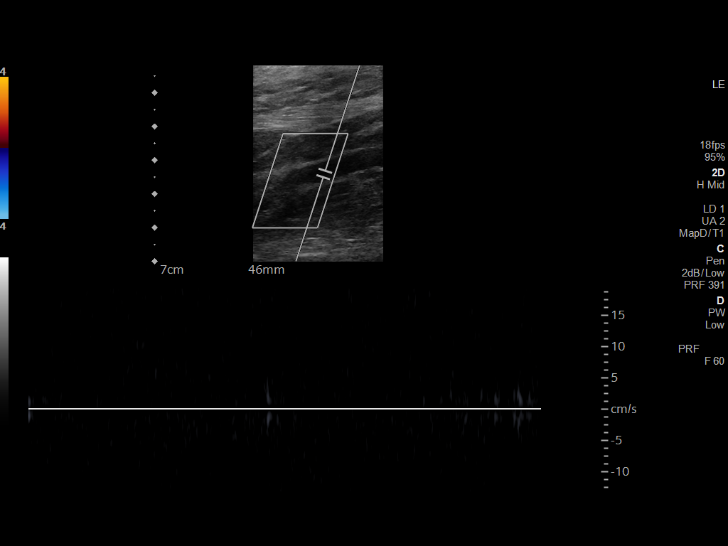
[im 43/43]
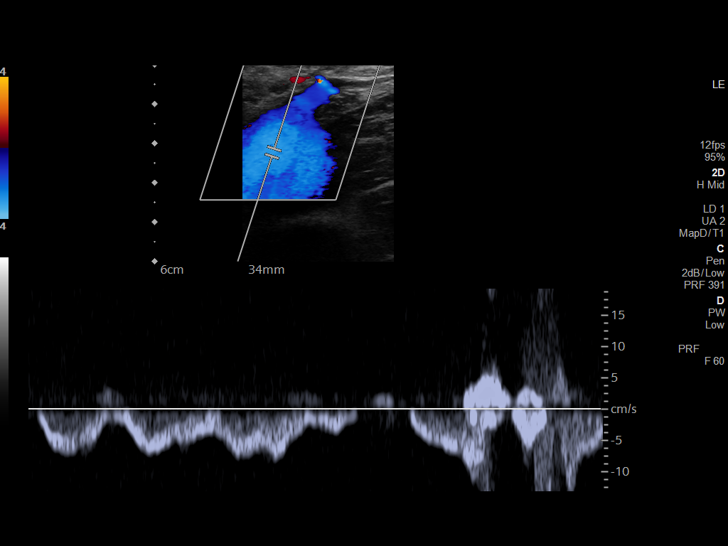

[14 of 24 positions shown; findings below may reference images not displayed]

FINDINGS: VENOUS

Normal compressibility of the LEFT common femoral, profunda femoral
vein and great saphenous vein. There is abnormal compressibility of
the LEFT superficial femoral, LEFT popliteal vein, as well as the
visualized LEFT calf veins. Filling defects consistent with DVT are
seen within these regions on grayscale and color Doppler imaging.
Doppler waveforms show abnormal direction of venous flow, abnormal
respiratory plasticity and abnormal response to augmentation.

Limited views of the contralateral common femoral vein are
unremarkable.

OTHER

None.

Limitations: none
IMPRESSION: Findings consistent with occlusive DVT within the LEFT femoral vein,
LEFT popliteal vein, LEFT posterior tibial vein and LEFT peroneal
veins.
